# Patient Record
Sex: Male | Born: 1975 | Race: White | Hispanic: No | Marital: Married | Smoking: Never smoker
Health system: Southern US, Community
[De-identification: ages and names within clinical notes are randomized; demographics above are authoritative.]

## PROBLEM LIST (undated history)

## (undated) DIAGNOSIS — E291 Testicular hypofunction: Secondary | ICD-10-CM

## (undated) DIAGNOSIS — M549 Dorsalgia, unspecified: Secondary | ICD-10-CM

## (undated) DIAGNOSIS — G4730 Sleep apnea, unspecified: Secondary | ICD-10-CM

## (undated) DIAGNOSIS — F419 Anxiety disorder, unspecified: Secondary | ICD-10-CM

## (undated) HISTORY — DX: Testicular hypofunction: E29.1

## (undated) HISTORY — DX: Anxiety disorder, unspecified: F41.9

## (undated) HISTORY — PX: MICRODISCECTOMY LUMBAR: SUR864

## (undated) HISTORY — DX: Dorsalgia, unspecified: M54.9

## (undated) HISTORY — DX: Sleep apnea, unspecified: G47.30

---

## 2008-08-23 ENCOUNTER — Ambulatory Visit: Payer: Self-pay | Admitting: Family Medicine

## 2008-08-23 DIAGNOSIS — S61409A Unspecified open wound of unspecified hand, initial encounter: Secondary | ICD-10-CM | POA: Insufficient documentation

## 2008-10-22 ENCOUNTER — Encounter: Admission: RE | Admit: 2008-10-22 | Discharge: 2008-10-22 | Payer: Self-pay | Admitting: Chiropractic Medicine

## 2014-06-26 ENCOUNTER — Encounter: Payer: Self-pay | Admitting: Family Medicine

## 2014-06-26 ENCOUNTER — Ambulatory Visit (INDEPENDENT_AMBULATORY_CARE_PROVIDER_SITE_OTHER): Payer: BLUE CROSS/BLUE SHIELD | Admitting: Family Medicine

## 2014-06-26 VITALS — BP 115/69 | HR 50 | Ht 76.0 in | Wt 278.0 lb

## 2014-06-26 DIAGNOSIS — F329 Major depressive disorder, single episode, unspecified: Secondary | ICD-10-CM

## 2014-06-26 DIAGNOSIS — G4733 Obstructive sleep apnea (adult) (pediatric): Secondary | ICD-10-CM

## 2014-06-26 DIAGNOSIS — F32A Depression, unspecified: Secondary | ICD-10-CM

## 2014-06-26 DIAGNOSIS — E291 Testicular hypofunction: Secondary | ICD-10-CM

## 2014-06-26 DIAGNOSIS — F321 Major depressive disorder, single episode, moderate: Secondary | ICD-10-CM | POA: Insufficient documentation

## 2014-06-26 DIAGNOSIS — Z9989 Dependence on other enabling machines and devices: Secondary | ICD-10-CM

## 2014-06-26 DIAGNOSIS — F325 Major depressive disorder, single episode, in full remission: Secondary | ICD-10-CM | POA: Insufficient documentation

## 2014-06-26 HISTORY — DX: Testicular hypofunction: E29.1

## 2014-06-26 MED ORDER — SERTRALINE HCL 50 MG PO TABS: ORAL_TABLET | ORAL | Status: DC

## 2014-06-26 NOTE — Progress Notes (Signed)
CC: Russell Ortega is a 39 y.o. male is here for Establish Care   Subjective: HPI:  Very pleasant 39 year old here to establish care  Reports a history of depression that began sometime around 2009. It was heavily influenced by dissatisfaction at his work. He describes subjective depression, decreased interest in hobbies and irritability towards others. Since starting Zoloft this is greatly improved along with changing to a new position that he is more satisfied with. He's been taking this medication daily, 100 mg, and would like to know if he can taper off of it given that  He doesn't identify anything that would be causing stress or worry in his life right now. Denies known side effects.  Report a history of hypogonadism that was found in 2009 during a fatigue workup. He's been taking AndroGel for at least a year now currently up to 4 pumps daily. Testosterone was checked in January found to be 273. He reports he still has some fatigue a few days of the week but overall it's improved mildly. Denies any known side effects to the medication.  History of OSA currently using CPAP nightly. Provided he uses this he denies any issues with snoring or nonrestorative sleep.  Review of Systems - General ROS: negative for - chills, fever, night sweats, weight gain or weight loss Ophthalmic ROS: negative for - decreased vision Psychological ROS: negative for - anxiety or depression ENT ROS: negative for - hearing change, nasal congestion, tinnitus or allergies Hematological and Lymphatic ROS: negative for - bleeding problems, bruising or swollen lymph nodes Breast ROS: negative Respiratory ROS: no cough, shortness of breath, or wheezing Cardiovascular ROS: no chest pain or dyspnea on exertion Gastrointestinal ROS: no abdominal pain, change in bowel habits, or black or bloody stools Genito-Urinary ROS: negative for - genital discharge, genital ulcers, incontinence or abnormal bleeding from  genitals Musculoskeletal ROS: negative for - joint pain or muscle pain Neurological ROS: negative for - headaches or memory loss Dermatological ROS: negative for lumps, mole changes, rash and skin lesion changes  History reviewed. No pertinent past medical history.  History reviewed. No pertinent past surgical history. Family History  Problem Relation Age of Onset  . Breast cancer Mother   . Hyperlipidemia Father   . Hypertension Father     History   Social History  . Marital Status: Married    Spouse Name: N/A  . Number of Children: N/A  . Years of Education: N/A   Occupational History  . Not on file.   Social History Main Topics  . Smoking status: Never Smoker   . Smokeless tobacco: Not on file  . Alcohol Use: 0.0 oz/week    0 Standard drinks or equivalent per week  . Drug Use: No  . Sexual Activity:    Partners: Female   Other Topics Concern  . Not on file   Social History Narrative     Objective: BP 115/69 mmHg  Pulse 50  Ht 6\' 4"  (1.93 m)  Wt 278 lb (126.1 kg)  BMI 33.85 kg/m2  Vital signs reviewed. General: Alert and Oriented, No Acute Distress HEENT: Pupils equal, round, reactive to light. Conjunctivae clear.  External ears unremarkable.  Moist mucous membranes. Lungs: Clear and comfortable work of breathing, speaking in full sentences without accessory muscle use. Cardiac: Regular rate and rhythm.  Neuro: CN II-XII grossly intact, gait normal. Extremities: No peripheral edema.  Strong peripheral pulses.  Mental Status: No depression, anxiety, nor agitation. Logical though process. Skin: Warm and  dry.  Assessment & Plan: Russell Ortega was seen today for establish care.  Diagnoses and all orders for this visit:  Depression Orders: -     sertraline (ZOLOFT) 50 MG tablet; Take one by mouth daily for two weeks then half tab daily for two weeks.  Hypogonadism in male Orders: -     Testosterone  OSA on CPAP   Depression: Controlled he would like to  taper off of Zoloft therefore start 50 mg daily for 2 weeks and then half a tab daily for the 2 weeks thereafter. Call if any symptoms return during this taper. Hypergonadism: Due for repeat testosterone level, he would like to look into either patches or injections if his level is still deficient.  Return in about 3 months (around 09/26/2014) for Testosterone follow up. .Marland Kitchen

## 2014-06-27 LAB — TESTOSTERONE: Testosterone: 591 ng/dL (ref 300–890)

## 2014-06-29 ENCOUNTER — Telehealth: Payer: Self-pay | Admitting: Family Medicine

## 2014-06-29 MED ORDER — ANDROGEL PUMP 20.25 MG/ACT (1.62%) TD GEL: TRANSDERMAL | Status: DC

## 2014-06-29 NOTE — Telephone Encounter (Signed)
Sue Lushndrea, Will you please let patient know that his testosterone level was in the normal range, refill Rx in your inbox.

## 2014-06-29 NOTE — Telephone Encounter (Signed)
Left message on vm with results  

## 2014-07-06 LAB — HEPATIC FUNCTION PANEL
ALT: 35 U/L (ref 10–40)
AST: 24 U/L (ref 14–40)
Alkaline Phosphatase: 70 U/L (ref 25–125)
Bilirubin, Total: 0.4 mg/dL

## 2014-07-06 LAB — CBC AND DIFFERENTIAL
Platelets: 221 10*3/uL (ref 150–399)
WBC: 7.4 10*3/mL

## 2014-07-06 LAB — HEMOGLOBIN A1C: Hgb A1c MFr Bld: 14 % — AB (ref 4.0–6.0)

## 2014-07-06 LAB — BASIC METABOLIC PANEL
CREATININE: 0.8 mg/dL (ref 0.6–1.3)
GLUCOSE: 95 mg/dL
POTASSIUM: 4.9 mmol/L (ref 3.4–5.3)

## 2014-07-08 ENCOUNTER — Encounter: Payer: Self-pay | Admitting: Family Medicine

## 2014-07-08 DIAGNOSIS — M5416 Radiculopathy, lumbar region: Secondary | ICD-10-CM | POA: Insufficient documentation

## 2014-07-10 ENCOUNTER — Encounter: Payer: Self-pay | Admitting: Family Medicine

## 2014-07-10 ENCOUNTER — Ambulatory Visit (INDEPENDENT_AMBULATORY_CARE_PROVIDER_SITE_OTHER): Payer: BLUE CROSS/BLUE SHIELD | Admitting: Family Medicine

## 2014-07-10 VITALS — BP 112/74 | HR 52 | Wt 270.0 lb

## 2014-07-10 DIAGNOSIS — R112 Nausea with vomiting, unspecified: Secondary | ICD-10-CM | POA: Diagnosis not present

## 2014-07-10 DIAGNOSIS — R12 Heartburn: Secondary | ICD-10-CM | POA: Diagnosis not present

## 2014-07-10 DIAGNOSIS — R1013 Epigastric pain: Secondary | ICD-10-CM | POA: Diagnosis not present

## 2014-07-10 MED ORDER — PANTOPRAZOLE SODIUM 40 MG PO TBEC: 40.0000 mg | DELAYED_RELEASE_TABLET | Freq: Every day | ORAL | Status: DC

## 2014-07-10 NOTE — Patient Instructions (Signed)
Food Choices for Gastroesophageal Reflux Disease  When you have gastroesophageal reflux disease (GERD), the foods you eat and your eating habits are very important. Choosing the right foods can help ease your discomfort.   WHAT GUIDELINES DO I NEED TO FOLLOW?   · Choose fruits, vegetables, whole grains, and low-fat dairy products.    · Choose low-fat meat, fish, and poultry.  · Limit fats such as oils, salad dressings, butter, nuts, and avocado.    · Keep a food diary. This helps you identify foods that cause symptoms.    · Avoid foods that cause symptoms. These may be different for everyone.    · Eat small meals often instead of 3 large meals a day.    · Eat your meals slowly, in a place where you are relaxed.    · Limit fried foods.    · Cook foods using methods other than frying.    · Avoid drinking alcohol.    · Avoid drinking large amounts of liquids with your meals.    · Avoid bending over or lying down until 2-3 hours after eating.    WHAT FOODS ARE NOT RECOMMENDED?   These are some foods and drinks that may make your symptoms worse:  Vegetables  Tomatoes. Tomato juice. Tomato and spaghetti sauce. Chili peppers. Onion and garlic. Horseradish.  Fruits  Oranges, grapefruit, and lemon (fruit and juice).  Meats  High-fat meats, fish, and poultry. This includes hot dogs, ribs, ham, sausage, salami, and bacon.  Dairy  Whole milk and chocolate milk. Sour cream. Cream. Butter. Ice cream. Cream cheese.   Drinks  Coffee and tea. Bubbly (carbonated) drinks or energy drinks.  Condiments  Hot sauce. Barbecue sauce.   Sweets/Desserts  Chocolate and cocoa. Donuts. Peppermint and spearmint.  Fats and Oils  High-fat foods. This includes French fries and potato chips.  Other  Vinegar. Strong spices. This includes black pepper, white pepper, red pepper, cayenne, curry powder, cloves, ginger, and chili powder.  The items listed above may not be a complete list of foods and drinks to avoid. Contact your dietitian for more  information.  Document Released: 08/08/2011 Document Revised: 02/11/2013 Document Reviewed: 12/11/2012  Exit[employer]® Patient Information ©2015 [employer]. This information is not intended to replace advice given to you by your health care provider. Make sure you discuss any questions you have with your health care provider.

## 2014-07-10 NOTE — Progress Notes (Signed)
   Subjective:    Patient ID: Russell Ortega, male    DOB: 08/27/1975, 39 y.o.   MRN: [id number]  HPI 3 days ago says had some severe heartburn.  No vomited and then had diarrhea for a couple of days.  Had crackers and gingerale.  Then late Wed ( 2 days ago) pain became more severe and no relief with eating or not eating.  Had episode about 2 years ago.  No fever, chills, sweat. No blood in the stool  Still having some loose stools. Has been taking lactobacillus for about 4 months.  Has been using Aleve at work for back pain.   Review of Systems     Objective:   Physical Exam  Constitutional: He is oriented to person, place, and time. He appears well-developed and well-nourished.  HENT:  Head: Normocephalic and atraumatic.  Right Ear: External ear normal.  Left Ear: External ear normal.  Nose: Nose normal.  Mouth/Throat: Oropharynx is clear and moist.  TMs and canals are clear.   Eyes: Conjunctivae and EOM are normal. Pupils are equal, round, and reactive to light.  Neck: Neck supple. No thyromegaly present.  Cardiovascular: Normal rate and normal heart sounds.   Pulmonary/Chest: Effort normal and breath sounds normal.  Abdominal: Soft. Bowel sounds are normal. He exhibits no distension and no mass. There is tenderness. There is no rebound and no guarding.  Tenderness over the epigastrum.    Lymphadenopathy:    He has no cervical adenopathy.  Neurological: He is alert and oriented to person, place, and time.  Skin: Skin is warm and dry.  Psychiatric: He has a normal mood and affect.          Assessment & Plan:  Epigastric pain, nausea - likely gastritis vs GERD. Will start a PPI.  Can use TUMs in the interim.  Please call Monday if not feeling better or if pain gets worse, or if notices blood in the stool, or if develops a fever. Encouraged him to stop all NSAIDs. Can use Tylenol for pain relief for now. Reviewed dietary measures for reflux and handout provided.  Back pain -  Switch to Tylenol for now and consider discussing with Dr. Ivan AnchorsHommel other pain reliever like Vimovo.

## 2014-07-14 ENCOUNTER — Encounter: Payer: Self-pay | Admitting: Family Medicine

## 2014-07-14 LAB — CALCIUM: CALCIUM: 9.4 mg/dL

## 2014-07-14 LAB — TESTOSTERONE: TESTOSTERONE: 315

## 2014-08-18 ENCOUNTER — Other Ambulatory Visit: Payer: Self-pay | Admitting: Family Medicine

## 2014-08-29 ENCOUNTER — Other Ambulatory Visit: Payer: Self-pay | Admitting: Family Medicine

## 2014-10-28 ENCOUNTER — Ambulatory Visit (INDEPENDENT_AMBULATORY_CARE_PROVIDER_SITE_OTHER): Payer: BLUE CROSS/BLUE SHIELD | Admitting: Family Medicine

## 2014-10-28 ENCOUNTER — Encounter: Payer: Self-pay | Admitting: Family Medicine

## 2014-10-28 VITALS — BP 111/63 | HR 52 | Wt 273.0 lb

## 2014-10-28 DIAGNOSIS — E291 Testicular hypofunction: Secondary | ICD-10-CM

## 2014-10-28 DIAGNOSIS — F32A Depression, unspecified: Secondary | ICD-10-CM

## 2014-10-28 DIAGNOSIS — F329 Major depressive disorder, single episode, unspecified: Secondary | ICD-10-CM

## 2014-10-28 LAB — HEMOGLOBIN: HEMOGLOBIN: 15.1 g/dL (ref 13.0–17.0)

## 2014-10-28 MED ORDER — SERTRALINE HCL 25 MG PO TABS: ORAL_TABLET | ORAL | Status: DC

## 2014-10-28 NOTE — Progress Notes (Signed)
CC: Russell Ortega is a 39 y.o. male is here for f/u depression   Subjective: HPI:  Follow-up hypogonadism: He ran out of testosterone topical gel a few months ago and try to CT still actually need to be on the medication. Within 3 days he experienced extreme fatigue which was present all hours of the day and did not resolve until 1 or 2 days after restarting the gel. He tells me he feels like he has plenty of energy and denies any known side effects. He would like refills possible.  Follow-up depression: He was unable to come off of Zoloft since I saw him last. The days following each step down and the taper caused him to be extremely irritable towards others and he didn't want to engage in any social activity. He denies any thoughts of harm self or others or subjective depression. He really would not like to take this medication if he can get by without it. He wants another way to stop this. He denies any known side effects from it but just doesn't like the idea of taking medication.   Review Of Systems Outlined In HPI  No past medical history on file.  No past surgical history on file. Family History  Problem Relation Age of Onset  . Breast cancer Mother   . Hyperlipidemia Father   . Hypertension Father     Social History   Social History  . Marital Status: Married    Spouse Name: N/A  . Number of Children: N/A  . Years of Education: N/A   Occupational History  . Not on file.   Social History Main Topics  . Smoking status: Never Smoker   . Smokeless tobacco: Not on file  . Alcohol Use: 0.0 oz/week    0 Standard drinks or equivalent per week  . Drug Use: No  . Sexual Activity:    Partners: Female   Other Topics Concern  . Not on file   Social History Narrative     Objective: BP 111/63 mmHg  Pulse 52  Wt 273 lb (123.832 kg)  Vital signs reviewed. General: Alert and Oriented, No Acute Distress HEENT: Pupils equal, round, reactive to light. Conjunctivae clear.   External ears unremarkable.  Moist mucous membranes. Lungs: Clear and comfortable work of breathing, speaking in full sentences without accessory muscle use. Cardiac: Regular rate and rhythm.  Neuro: CN II-XII grossly intact, gait normal. Extremities: No peripheral edema.  Strong peripheral pulses.  Mental Status: No depression, anxiety, nor agitation. Logical though process. Skin: Warm and dry.  Assessment & Plan: Russell Ortega was seen today for f/u depression.  Diagnoses and all orders for this visit:  Hypogonadism in male -     Hemoglobin -     PSA -     Testosterone  Depression  Other orders -     sertraline (ZOLOFT) 25 MG tablet; One by mouth daily for two weeks, then begin half tablet daily thereafter.  Consider stopping after four total weeks.   Hypogonadism: Clinically controlled to for repeat testosterone level to ensure he remains in a physiologic range and to ensure hemoglobin and PSA are not elevated. Time was taken to answer all his questions regarding the physiology of testosterone and why we checked the above tests. Depression: Controlled, we discussed a longer taper in hopes of minimizing any side effects while coming off of this medication. I also discussed that there is a liquid formulation that we could use if any abrupt decrease in the  dose causes intolerable side effects.  25 minutes spent face-to-face during visit today of which at least 50% was counseling or coordinating care regarding: 1. Hypogonadism in male   2. Depression      Return in about 6 months (around 04/27/2015) for Testosterone Follow Up.

## 2014-10-29 ENCOUNTER — Telehealth: Payer: Self-pay | Admitting: Family Medicine

## 2014-10-29 LAB — PSA: PSA: 0.72 ng/mL (ref <=4.00)

## 2014-10-29 LAB — TESTOSTERONE: TESTOSTERONE: 399 ng/dL (ref 300–890)

## 2014-10-29 MED ORDER — TESTOSTERONE 20.25 MG/ACT (1.62%) TD GEL: TRANSDERMAL | Status: DC

## 2014-10-29 NOTE — Telephone Encounter (Signed)
Pt.notified

## 2014-10-29 NOTE — Telephone Encounter (Signed)
Sue Lush, Will you please let patient know that his labs were normal, a Rx for testosterone is in your in box.  I'd recommend f/u in 6 months.

## 2014-12-30 ENCOUNTER — Emergency Department
Admission: EM | Admit: 2014-12-30 | Discharge: 2014-12-30 | Disposition: A | Discharge status: Home / Self Care | Payer: BLUE CROSS/BLUE SHIELD | Source: Home / Self Care | Attending: Family Medicine | Admitting: Family Medicine

## 2014-12-30 ENCOUNTER — Encounter: Payer: Self-pay | Admitting: *Deleted

## 2014-12-30 DIAGNOSIS — R509 Fever, unspecified: Secondary | ICD-10-CM | POA: Diagnosis not present

## 2014-12-30 LAB — POCT INFLUENZA A/B
Influenza A, POC: NEGATIVE
Influenza B, POC: NEGATIVE

## 2014-12-30 MED ORDER — LOPERAMIDE HCL 2 MG PO CAPS: 2.0000 mg | ORAL_CAPSULE | Freq: Four times a day (QID) | ORAL | Status: DC | PRN

## 2014-12-30 MED ORDER — ACETAMINOPHEN 325 MG PO TABS
650.0000 mg | ORAL_TABLET | Freq: Once | ORAL | Status: AC
  Administered 2014-12-30: 650 mg via ORAL

## 2014-12-30 NOTE — ED Provider Notes (Signed)
CSN: [id number]     Arrival date & time 12/30/14  1702 History   First MD Initiated Contact with Patient 12/30/14 1713     Chief Complaint  Patient presents with  . Diarrhea  . Generalized Body Aches   (Consider location/radiation/quality/duration/timing/severity/associated sxs/prior Treatment) HPI Pt is a 39yo male presenting to [facility] with c/o sudden onset body aches, mild generalized abdominal cramping and diarrhea that started this morning. Body aches are 6/10 in severity.  Pt also reports fever up to 100.4.  He has not tried any medications PTA. Pt reports about 3 episodes of watery diarrhea then 1-2 other episodes of loose stool. No blood or mucous in stool. No hx of abdominal surgeries. No sick contacts or recent travel.  He did not receive flu vaccine this year.   History reviewed. No pertinent past medical history. History reviewed. No pertinent past surgical history. Family History  Problem Relation Age of Onset  . Breast cancer Mother   . Hyperlipidemia Father   . Hypertension Father    Social History  Substance Use Topics  . Smoking status: Never Smoker   . Smokeless tobacco: None  . Alcohol Use: 0.0 oz/week    0 Standard drinks or equivalent per week    Review of Systems  Constitutional: Positive for fever, chills and fatigue.  HENT: Negative for congestion.   Respiratory: Negative for cough and shortness of breath.   Gastrointestinal: Positive for abdominal pain (cramping) and diarrhea. Negative for nausea, vomiting and constipation.  Genitourinary: Negative for dysuria, frequency and hematuria.  Musculoskeletal: Positive for myalgias and arthralgias.       Body aches    Allergies  Lidocaine  Home Medications   Prior to Admission medications   Medication Sig Start Date End Date Taking? Authorizing Provider  loperamide (IMODIUM) 2 MG capsule Take 1 capsule (2 mg total) by mouth 4 (four) times daily as needed for diarrhea or loose stools. 12/30/14   Junius Finner,  PA-C  pantoprazole (PROTONIX) 40 MG tablet Take 1 tablet (40 mg total) by mouth daily. 07/10/14   Agapito Games, MD  sertraline (ZOLOFT) 25 MG tablet One by mouth daily for two weeks, then begin half tablet daily thereafter.  Consider stopping after four total weeks. 10/28/14   Laren Boom, DO  Testosterone (ANDROGEL PUMP) 20.25 MG/ACT (1.62%) GEL APPLY 4 PUMPS TO THE SHOULDERS DAILY AS DIRECTED 10/29/14   Laren Boom, DO   Meds Ordered and Administered this Visit   Medications  acetaminophen (TYLENOL) tablet 650 mg (650 mg Oral Given 12/30/14 1731)    BP 105/71 mmHg  Pulse 107  Temp(Src) 100.4 F (38 C) (Oral)  Resp 18  Wt 273 lb (123.832 kg)  SpO2 99% No data found.   Physical Exam  Constitutional: He appears well-developed and well-nourished.  Pt lying on exam bed, appears mildly fatigued and uncomfortable   HENT:  Head: Normocephalic and atraumatic.  Eyes: Conjunctivae are normal. No scleral icterus.  Neck: Normal range of motion. Neck supple.  Cardiovascular: Regular rhythm and normal heart sounds.  Tachycardia present.   Mild tachycardia  Pulmonary/Chest: Effort normal and breath sounds normal. No respiratory distress. He has no wheezes. He has no rales. He exhibits no tenderness.  Abdominal: Soft. He exhibits no distension and no mass. Bowel sounds are increased. There is tenderness. There is no rebound and no guarding.  Soft, non-distended. Mild diffuse tenderness.   Musculoskeletal: Normal range of motion.  Neurological: He is alert.  Skin: Skin is warm  and dry.  Nursing note and vitals reviewed.   ED Course  Procedures (including critical care time)  Labs Review Labs Reviewed  POCT INFLUENZA A/B    Imaging Review No results found.    MDM   1. Fever, unspecified     Pt c/o body aches, generalized abdominal cramping and diarrhea. Pt appears well hydrated with moist mucous membranes but does have mild tachycardia with HR at 107.  Likely due to pt  having a temp of 100.4 and body aches. Rapid Flu: negative Pt appears well hydrated. Abdominal exam not concerning for acute abdomen including but not limited to diverticulitis, SBO, cholecystitis or appendicitis. Will hold off on labs at this time.   Rx: imodium.  Encouraged to only take if he is having more than 5 episodes of watery diarrhea. Encouraged to rest, stay well hydrated, and alternate acetaminophen and ibuprofen for fever and pain. F/u with PCP in 3-4 days if not improving, sooner if worsening.  Discussed symptoms that warrant emergent care in the ED. Pt initially declined a work note but then accepted one. Note written for pt to be off 1 day.  Patient verbalized understanding and agreement with treatment plan.    Junius Finnerrin O'Malley, PA-C 12/30/14 1742

## 2014-12-30 NOTE — ED Notes (Signed)
Pt c/o body aches, diarrhea since this AM. Temp of 100.4. No otc meds used.

## 2014-12-30 NOTE — Discharge Instructions (Signed)
You may take 400-600mg  Ibuprofen (Motrin) every 6-8 hours for fever and pain  Alternate with Tylenol  You may take 500mg  Tylenol every 4-6 hours as needed for fever and pain  Follow-up with your primary care provider next week for recheck of symptoms if not improving.  Be sure to drink plenty of fluids and rest, at least 8hrs of sleep a night, preferably more while you are sick. Return urgent care or go to closest ER if you cannot keep down fluids/signs of dehydration, fever not reducing with Tylenol, difficulty breathing/wheezing, stiff neck, worsening condition, or other concerns (see below)  Call 911 or go to closest ER if symptoms worsen including severe abdominal pain, unable to keep down fluids, blood in stool, or other new concerning symptoms develop

## 2015-01-25 ENCOUNTER — Telehealth: Payer: Self-pay

## 2015-01-25 NOTE — Telephone Encounter (Signed)
PA for Androgel sent through covermymeds. PA approved.

## 2015-02-01 ENCOUNTER — Other Ambulatory Visit: Payer: Self-pay | Admitting: Family Medicine

## 2015-02-02 NOTE — Telephone Encounter (Signed)
I'm not sure if this refill is appropriate looking at your previous insrtuctions for pt.

## 2015-02-02 NOTE — Telephone Encounter (Signed)
I've sent a Rx to his [facility] and would recommend he f/u with me within the next 30 days to discuss a long term plan for this medication.

## 2015-03-01 ENCOUNTER — Other Ambulatory Visit: Payer: Self-pay | Admitting: Family Medicine

## 2015-03-20 ENCOUNTER — Other Ambulatory Visit: Payer: Self-pay | Admitting: Family Medicine

## 2015-03-30 ENCOUNTER — Encounter: Payer: Self-pay | Admitting: Emergency Medicine

## 2015-03-30 ENCOUNTER — Emergency Department
Admission: EM | Admit: 2015-03-30 | Discharge: 2015-03-30 | Disposition: A | Discharge status: Home / Self Care | Payer: BLUE CROSS/BLUE SHIELD | Source: Home / Self Care | Attending: Family Medicine | Admitting: Family Medicine

## 2015-03-30 DIAGNOSIS — J019 Acute sinusitis, unspecified: Secondary | ICD-10-CM | POA: Diagnosis not present

## 2015-03-30 MED ORDER — AMOXICILLIN-POT CLAVULANATE 875-125 MG PO TABS: 1.0000 | ORAL_TABLET | Freq: Two times a day (BID) | ORAL | Status: DC

## 2015-03-30 MED ORDER — SALINE SPRAY 0.65 % NA SOLN: 1.0000 | NASAL | Status: DC | PRN

## 2015-03-30 NOTE — ED Notes (Signed)
Sinus pain, pressure, congestion, cough x 3 days

## 2015-03-30 NOTE — Discharge Instructions (Signed)
You may take 400-600mg Ibuprofen (Motrin) every 6-8 hours for fever and pain  °Alternate with Tylenol  °You may take 500mg Tylenol every 4-6 hours as needed for fever and pain  °Follow-up with your primary care provider next week for recheck of symptoms if not improving.  °Be sure to drink plenty of fluids and rest, at least 8hrs of sleep a night, preferably more while you are sick. °Return urgent care or go to closest ER if you cannot keep down fluids/signs of dehydration, fever not reducing with Tylenol, difficulty breathing/wheezing, stiff neck, worsening condition, or other concerns (see below)  °Please take antibiotics as prescribed and be sure to complete entire course even if you start to feel better to ensure infection does not come back. ° ° °Sinus Rinse °WHAT IS A SINUS RINSE? °A sinus rinse is a simple home treatment that is used to rinse your sinuses with a sterile mixture of salt and water (saline solution). Sinuses are air-filled spaces in your skull behind the bones of your face and forehead that open into your nasal cavity. °You will use the following: °· Saline solution. °· Neti pot or spray bottle. This releases the saline solution into your nose and through your sinuses. Neti pots and spray bottles can be purchased at your local pharmacy, a health food store, or online. °WHEN WOULD I DO A SINUS RINSE? °A sinus rinse can help to clear mucus, dirt, dust, or pollen from the nasal cavity. You may do a sinus rinse when you have a cold, a virus, nasal allergy symptoms, a sinus infection, or stuffiness in the nose or sinuses. °If you are considering a sinus rinse: °· Ask your child's health care provider before performing a sinus rinse on your child. °· Do not do a sinus rinse if you have had ear or nasal surgery, ear infection, or blocked ears. °HOW DO I DO A SINUS RINSE? °· Wash your hands. °· Disinfect your device according to the directions provided and then dry it. °· Use the solution that comes  with your device or one that is sold separately in stores. Follow the mixing directions on the package. °· Fill your device with the amount of saline solution as directed by the device instructions. °· Stand over a sink and tilt your head sideways over the sink. °· Place the spout of the device in your upper nostril (the one closer to the ceiling). °· Gently pour or squeeze the saline solution into the nasal cavity. The liquid should drain to the lower nostril if you are not overly congested. °· Gently blow your nose. Blowing too hard may cause ear pain. °· Repeat in the other nostril. °· Clean and rinse your device with clean water and then air-dry it. °ARE THERE RISKS OF A SINUS RINSE?  °Sinus rinse is generally very safe and effective. However, there are a few risks, which include:  °· A burning sensation in the sinuses. This may happen if you do not make the saline solution as directed. Make sure to follow all directions when making the saline solution. °· Infection from contaminated water. This is rare, but possible. °· Nasal irritation. °  °This information is not intended to replace advice given to you by your health care provider. Make sure you discuss any questions you have with your health care provider. °  °Document Released: 09/03/2013 Document Reviewed: 09/03/2013 °Elsevier Interactive Patient Education ©2016 [employer] ° °Sinusitis, Adult °Sinusitis is redness, soreness, and inflammation of the paranasal sinuses.   Paranasal sinuses are air pockets within the bones of your face. They are located beneath your eyes, in the middle of your forehead, and above your eyes. In healthy paranasal sinuses, mucus is able to drain out, and air is able to circulate through them by way of your nose. However, when your paranasal sinuses are inflamed, mucus and air can become trapped. This can allow bacteria and other germs to grow and cause infection. °Sinusitis can develop quickly and last only a short time (acute)  or continue over a long period (chronic). Sinusitis that lasts for more than 12 weeks is considered chronic. °CAUSES °Causes of sinusitis include: °· Allergies. °· Structural abnormalities, such as displacement of the cartilage that separates your nostrils (deviated septum), which can decrease the air flow through your nose and sinuses and affect sinus drainage. °· Functional abnormalities, such as when the small hairs (cilia) that line your sinuses and help remove mucus do not work properly or are not present. °SIGNS AND SYMPTOMS °Symptoms of acute and chronic sinusitis are the same. The primary symptoms are pain and pressure around the affected sinuses. Other symptoms include: °· Upper toothache. °· Earache. °· Headache. °· Bad breath. °· Decreased sense of smell and taste. °· A cough, which worsens when you are lying flat. °· Fatigue. °· Fever. °· Thick drainage from your nose, which often is green and may contain pus (purulent). °· Swelling and warmth over the affected sinuses. °DIAGNOSIS °Your health care provider will perform a physical exam. During your exam, your health care provider may perform any of the following to help determine if you have acute sinusitis or chronic sinusitis: °· Look in your nose for signs of abnormal growths in your nostrils (nasal polyps). °· Tap over the affected sinus to check for signs of infection. °· View the inside of your sinuses using an imaging device that has a light attached (endoscope). °If your health care provider suspects that you have chronic sinusitis, one or more of the following tests may be recommended: °· Allergy tests. °· Nasal culture. A sample of mucus is taken from your nose, sent to a lab, and screened for bacteria. °· Nasal cytology. A sample of mucus is taken from your nose and examined by your health care provider to determine if your sinusitis is related to an allergy. °TREATMENT °Most cases of acute sinusitis are related to a viral infection and will  resolve on their own within 10 days. Sometimes, medicines are prescribed to help relieve symptoms of both acute and chronic sinusitis. These may include pain medicines, decongestants, nasal steroid sprays, or saline sprays. °However, for sinusitis related to a bacterial infection, your health care provider will prescribe antibiotic medicines. These are medicines that will help kill the bacteria causing the infection. °Rarely, sinusitis is caused by a fungal infection. In these cases, your health care provider will prescribe antifungal medicine. °For some cases of chronic sinusitis, surgery is needed. Generally, these are cases in which sinusitis recurs more than 3 times per year, despite other treatments. °HOME CARE INSTRUCTIONS °· Drink plenty of water. Water helps thin the mucus so your sinuses can drain more easily. °· Use a humidifier. °· Inhale steam 3-4 times a day (for example, sit in the bathroom with the shower running). °· Apply a warm, moist washcloth to your face 3-4 times a day, or as directed by your health care provider. °· Use saline nasal sprays to help moisten and clean your sinuses. °· Take medicines only as directed   by your health care provider. °· If you were prescribed either an antibiotic or antifungal medicine, finish it all even if you start to feel better. °SEEK IMMEDIATE MEDICAL CARE IF: °· You have increasing pain or severe headaches. °· You have nausea, vomiting, or drowsiness. °· You have swelling around your face. °· You have vision problems. °· You have a stiff neck. °· You have difficulty breathing. °  °This information is not intended to replace advice given to you by your health care provider. Make sure you discuss any questions you have with your health care provider. °  °Document Released: 02/06/2005 Document Revised: 02/27/2014 Document Reviewed: 02/21/2011 °Elsevier Interactive Patient Education ©2016 [employer] Inc. ° ° °

## 2015-03-30 NOTE — ED Provider Notes (Signed)
CSN: [id number]     Arrival date & time 03/30/15  1335 History   First MD Initiated Contact with Patient 03/30/15 1341     Chief Complaint  Patient presents with  . Sinus Problem   (Consider location/radiation/quality/duration/timing/severity/associated sxs/prior Treatment) HPI Pt is a 40yo male presenting to [facility] with c/o nasal congestion with worsening facial pain.  He also reports mild intermittent productive cough.  He has been taking OTC cold/flu medication with mild temporary relief but facial pain keeps worsening.  Pain is moderate and aching.  He also reports mild dizziness with the pressure, worse when leaning his head forward. Denies fever, chills, n/v/d.   History reviewed. No pertinent past medical history. History reviewed. No pertinent past surgical history. Family History  Problem Relation Age of Onset  . Breast cancer Mother   . Hyperlipidemia Father   . Hypertension Father    Social History  Substance Use Topics  . Smoking status: Never Smoker   . Smokeless tobacco: None  . Alcohol Use: 0.0 oz/week    0 Standard drinks or equivalent per week    Review of Systems  Constitutional: Negative for fever and chills.  HENT: Positive for congestion, postnasal drip, rhinorrhea, sinus pressure and sore throat. Negative for ear pain, trouble swallowing and voice change.   Respiratory: Positive for cough. Negative for shortness of breath.   Cardiovascular: Negative for chest pain and palpitations.  Gastrointestinal: Negative for nausea, vomiting, abdominal pain and diarrhea.  Musculoskeletal: Negative for myalgias, back pain and arthralgias.  Skin: Negative for rash.  Neurological: Positive for dizziness and headaches. Negative for syncope and light-headedness.    Allergies  Lidocaine  Home Medications   Prior to Admission medications   Medication Sig Start Date End Date Taking? Authorizing Provider  amoxicillin-clavulanate (AUGMENTIN) 875-125 MG tablet Take 1 tablet by  mouth 2 (two) times daily. One po bid x 7 days 03/30/15   Junius Finner, PA-C  ANDROGEL PUMP 20.25 MG/ACT (1.62%) GEL APPLY 4 PUMPS TOPICALLY TO THE SHOULDERS EVERY DAY AS DIRECTED 03/22/15   Laren Boom, DO  loperamide (IMODIUM) 2 MG capsule Take 1 capsule (2 mg total) by mouth 4 (four) times daily as needed for diarrhea or loose stools. 12/30/14   Junius Finner, PA-C  pantoprazole (PROTONIX) 40 MG tablet Take 1 tablet (40 mg total) by mouth daily. 07/10/14   Agapito Games, MD  sertraline (ZOLOFT) 25 MG tablet TAKE 1 TABLET BY MOUTH DAILY. DUE FOR A FOLLOW UP APPOINTMENT 03/22/15   Laren Boom, DO  sodium chloride (OCEAN) 0.65 % SOLN nasal spray Place 1 spray into both nostrils as needed. 03/30/15   Junius Finner, PA-C   Meds Ordered and Administered this Visit  Medications - No data to display  BP 118/73 mmHg  Pulse 57  Temp(Src) 97.7 F (36.5 C) (Oral)  Ht  (1.93 m)  Wt 275 lb (124.739 kg)  BMI 33.49 kg/m2  SpO2 96% No data found.   Physical Exam  Constitutional: He appears well-developed and well-nourished.  HENT:  Head: Normocephalic and atraumatic.  Right Ear: Hearing, tympanic membrane, external ear and ear canal normal.  Left Ear: Hearing, tympanic membrane, external ear and ear canal normal.  Nose: Mucosal edema and rhinorrhea present. Right sinus exhibits maxillary sinus tenderness and frontal sinus tenderness. Left sinus exhibits maxillary sinus tenderness and frontal sinus tenderness.  Mouth/Throat: Uvula is midline, oropharynx is clear and moist and mucous membranes are normal.  Eyes: Conjunctivae are normal. No scleral icterus.  Neck: Normal range of motion. Neck supple.  Cardiovascular: Normal rate, regular rhythm and normal heart sounds.   Pulmonary/Chest: Effort normal and breath sounds normal. No stridor. No respiratory distress. He has no wheezes. He has no rales. He exhibits no tenderness.  Abdominal: Soft. He exhibits no distension. There is no tenderness.   Musculoskeletal: Normal range of motion.  Lymphadenopathy:    He has no cervical adenopathy.  Neurological: He is alert.  Skin: Skin is warm and dry.  Nursing note and vitals reviewed.   ED Course  Procedures (including critical care time)  Labs Review Labs Reviewed - No data to display  Imaging Review No results found.    MDM   1. Acute rhinosinusitis     Pt c/o worsening frontal headache with sinus pressure, nasal congestion and dizziness.  Due to severity of pain, will tx for bacterial cause of sinusitis. Rx: augmentin and nasal saline  Advised pt to use acetaminophen and ibuprofen as needed for fever and pain. Encouraged rest and fluids. F/u with PCP in 7-10 days if not improving, sooner if worsening. Pt verbalized understanding and agreement with tx plan.   Junius Finner, PA-C 03/30/15 [phone]

## 2015-04-07 ENCOUNTER — Other Ambulatory Visit: Payer: Self-pay | Admitting: Family Medicine

## 2015-04-22 ENCOUNTER — Other Ambulatory Visit: Payer: Self-pay | Admitting: Family Medicine

## 2015-04-22 ENCOUNTER — Telehealth: Payer: Self-pay | Admitting: Family Medicine

## 2015-04-22 MED ORDER — TESTOSTERONE 20.25 MG/ACT (1.62%) TD GEL: TRANSDERMAL | Status: DC

## 2015-04-22 NOTE — Addendum Note (Signed)
Addended by: Laren Boom on: 04/22/2015 11:58 AM   Modules accepted: Orders

## 2015-04-22 NOTE — Telephone Encounter (Signed)
I called patient and he let me know he does have a appointment scheduled with Dr. Ivan Anchors on 05/04/15 at 10:45. - CF

## 2015-04-27 ENCOUNTER — Ambulatory Visit: Payer: Self-pay | Admitting: Family Medicine

## 2015-05-04 ENCOUNTER — Ambulatory Visit: Payer: Self-pay | Admitting: Family Medicine

## 2015-05-07 ENCOUNTER — Encounter: Payer: Self-pay | Admitting: Family Medicine

## 2015-05-07 ENCOUNTER — Ambulatory Visit (INDEPENDENT_AMBULATORY_CARE_PROVIDER_SITE_OTHER): Payer: BLUE CROSS/BLUE SHIELD | Admitting: Family Medicine

## 2015-05-07 VITALS — BP 120/75 | HR 65 | Wt 280.0 lb

## 2015-05-07 DIAGNOSIS — F329 Major depressive disorder, single episode, unspecified: Secondary | ICD-10-CM

## 2015-05-07 DIAGNOSIS — E291 Testicular hypofunction: Secondary | ICD-10-CM

## 2015-05-07 DIAGNOSIS — F32A Depression, unspecified: Secondary | ICD-10-CM

## 2015-05-07 MED ORDER — SERTRALINE HCL 25 MG PO TABS: ORAL_TABLET | ORAL | Status: DC

## 2015-05-07 NOTE — Progress Notes (Signed)
CC: Russell Ortega is a 40 y.o. male is here for Depression   Subjective: HPI:  Follow-up hypogonadism: He tells me that using the topical gel daily is starting to become a hassle. He wants something else he can use for testosterone supplementation. He still feels of this has greatly improved his depression and fatigue. He denies any known side effects.  He would like to start tapering down off of Zoloft. He wants know the what's the easiest way to do this. In the past he's had difficulty with this however he was never on testosterone when that happened and he suspects that he   was actually feeling depressed due to hypogonadism and not any mental disturbance. He denies any depression or anxiety at the present time.  Review Of Systems Outlined In HPI  No past medical history on file.  No past surgical history on file. Family History  Problem Relation Age of Onset  . Breast cancer Mother   . Hyperlipidemia Father   . Hypertension Father     Social History   Social History  . Marital Status: Married    Spouse Name: N/A  . Number of Children: N/A  . Years of Education: N/A   Occupational History  . Not on file.   Social History Main Topics  . Smoking status: Never Smoker   . Smokeless tobacco: Not on file  . Alcohol Use: 0.0 oz/week    0 Standard drinks or equivalent per week  . Drug Use: No  . Sexual Activity:    Partners: Female   Other Topics Concern  . Not on file   Social History Narrative     Objective: BP 120/75 mmHg  Pulse 65  Wt 280 lb (127.007 kg)  Vital signs reviewed. General: Alert and Oriented, No Acute Distress HEENT: Pupils equal, round, reactive to light. Conjunctivae clear.  External ears unremarkable.  Moist mucous membranes. Lungs: Clear and comfortable work of breathing, speaking in full sentences without accessory muscle use. Cardiac: Regular rate and rhythm.  Neuro: CN II-XII grossly intact, gait normal. Extremities: No peripheral edema.   Strong peripheral pulses.  Mental Status: No depression, anxiety, nor agitation. Logical though process. Skin: Warm and dry. Assessment & Plan: Russell Ortega was seen today for depression.  Diagnoses and all orders for this visit:  Hypogonadism in male  Depression  Other orders -     Cancel: Testosterone (ANDROGEL PUMP) 20.25 MG/ACT (1.62%) GEL; Apply two pumps to each shoulder daily. -     sertraline (ZOLOFT) 25 MG tablet; TAKE 1/2 TABLET BY MOUTH DAILY.   Hypogonadism: Discussed the option of injectable testosterone supplementation, he likes the idea of this and will start today with a nurse administered dose followed by a teaching session and dose 2 weeks later with a nurse visit   depression: Controlled with Zoloft, starting tapering process with 12.5 mg daily  Return in about 2 weeks (around 05/21/2015) for Nurse Visit for T shot..Marland Kitchen

## 2015-05-21 ENCOUNTER — Ambulatory Visit (INDEPENDENT_AMBULATORY_CARE_PROVIDER_SITE_OTHER): Payer: BLUE CROSS/BLUE SHIELD | Admitting: Family Medicine

## 2015-05-21 VITALS — BP 119/68 | HR 49

## 2015-05-21 DIAGNOSIS — E291 Testicular hypofunction: Secondary | ICD-10-CM

## 2015-05-21 MED ORDER — TESTOSTERONE CYPIONATE 200 MG/ML IM SOLN
200.0000 mg | Freq: Once | INTRAMUSCULAR | Status: AC
  Administered 2015-05-21: 200 mg via INTRAMUSCULAR

## 2015-05-21 NOTE — Progress Notes (Signed)
Russell Ortega is here for a testosterone injection. Denies chest pain, shortness of breath, headaches or mood changes.    Patient tolerated injection well without complications. Patient advised to schedule next injection 14 days from today.

## 2015-06-03 ENCOUNTER — Ambulatory Visit (INDEPENDENT_AMBULATORY_CARE_PROVIDER_SITE_OTHER): Payer: BLUE CROSS/BLUE SHIELD | Admitting: Family Medicine

## 2015-06-03 ENCOUNTER — Encounter: Payer: Self-pay | Admitting: Family Medicine

## 2015-06-03 VITALS — BP 149/52 | HR 60 | Ht 76.0 in | Wt 285.0 lb

## 2015-06-03 DIAGNOSIS — E291 Testicular hypofunction: Secondary | ICD-10-CM

## 2015-06-03 MED ORDER — TESTOSTERONE CYPIONATE 200 MG/ML IM SOLN
200.0000 mg | Freq: Once | INTRAMUSCULAR | Status: AC
  Administered 2015-06-03: 200 mg via INTRAMUSCULAR

## 2015-06-03 NOTE — Progress Notes (Signed)
   Subjective:    Patient ID: Russell Ortega, male    DOB: 12/31/1975, 40 y.o.   MRN: [id number] Pt in for testosterone injection today.  Given LUOQ with no complications.   Donne AnonAmber Tobiah Celestine, CMA HPI    Review of Systems     Objective:   Physical Exam        Assessment & Plan:

## 2015-06-17 ENCOUNTER — Ambulatory Visit: Payer: Self-pay

## 2015-06-18 ENCOUNTER — Ambulatory Visit (INDEPENDENT_AMBULATORY_CARE_PROVIDER_SITE_OTHER): Payer: BLUE CROSS/BLUE SHIELD | Admitting: Family Medicine

## 2015-06-18 VITALS — BP 105/65 | HR 50 | Wt 287.0 lb

## 2015-06-18 DIAGNOSIS — E291 Testicular hypofunction: Secondary | ICD-10-CM | POA: Diagnosis not present

## 2015-06-18 MED ORDER — TESTOSTERONE CYPIONATE 200 MG/ML IM SOLN
200.0000 mg | Freq: Once | INTRAMUSCULAR | Status: AC
  Administered 2015-06-18: 200 mg via INTRAMUSCULAR

## 2015-06-18 NOTE — Progress Notes (Signed)
Russell Ortega is here for a testosterone injection. Denies chest pain, shortness of breath, headaches or mood changes.   Patient tolerated injection well without complications. Patient advised to schedule next injection 14 days from today.

## 2015-07-01 ENCOUNTER — Ambulatory Visit (INDEPENDENT_AMBULATORY_CARE_PROVIDER_SITE_OTHER): Payer: BLUE CROSS/BLUE SHIELD | Admitting: Family Medicine

## 2015-07-01 VITALS — BP 126/73 | HR 51 | Ht 76.0 in | Wt 286.0 lb

## 2015-07-01 DIAGNOSIS — E291 Testicular hypofunction: Secondary | ICD-10-CM

## 2015-07-01 MED ORDER — TESTOSTERONE CYPIONATE 200 MG/ML IM SOLN
200.0000 mg | INTRAMUSCULAR | Status: DC
  Administered 2015-07-01: 200 mg via INTRAMUSCULAR

## 2015-07-01 NOTE — Progress Notes (Signed)
Yes he can start giving himself injections at home as long as he can show one of the assistants here that he knows how to do it with good form.  Taking 100mg  weekly is also perfectly fine if he'd like to do that instead.

## 2015-07-01 NOTE — Progress Notes (Signed)
Patient was in office for Testosterone injection. 200 mg of Depo Testosterone was given LUOQ. Patient denied any headaches, dizziness, or shortness of Breath. Patient did want to know if he could start giving himself injections at home. He wants to know if he can do 100 mg once a week to level out his numbers. Please advise. Russell Ortega,CMA

## 2015-07-16 ENCOUNTER — Telehealth: Payer: Self-pay | Admitting: *Deleted

## 2015-07-16 ENCOUNTER — Ambulatory Visit (INDEPENDENT_AMBULATORY_CARE_PROVIDER_SITE_OTHER): Payer: BLUE CROSS/BLUE SHIELD | Admitting: Family Medicine

## 2015-07-16 VITALS — BP 124/69 | HR 44 | Wt 287.0 lb

## 2015-07-16 DIAGNOSIS — E291 Testicular hypofunction: Secondary | ICD-10-CM | POA: Diagnosis not present

## 2015-07-16 MED ORDER — "HYPODERMIC NEEDLE 22G X 1-1/2"" MISC": Status: DC

## 2015-07-16 MED ORDER — SYRINGE (DISPOSABLE) 1 ML MISC: Status: DC

## 2015-07-16 MED ORDER — "NEEDLE (DISP) 18G X 1-1/2"" MISC": Status: DC

## 2015-07-16 MED ORDER — TESTOSTERONE CYPIONATE 200 MG/ML IM SOLN: 100.0000 mg | INTRAMUSCULAR | Status: DC

## 2015-07-16 MED ORDER — TESTOSTERONE CYPIONATE 200 MG/ML IM SOLN
200.0000 mg | Freq: Once | INTRAMUSCULAR | Status: AC
  Administered 2015-07-16: 200 mg via INTRAMUSCULAR

## 2015-07-16 NOTE — Progress Notes (Signed)
Patient came into office today for testosterone injection. Denies chest pain, shortness of breath, headaches and problems associated with taking this medication. Patient states he has had no abnornal mood swings. Patient does report he feels more fatigued as he nears his next injection. He questions if doing 100mg  weekly would be an option. I recommended that if Pt wants to inject weekly to come in for education on self-administration, Pt is fine with that plan. Patient tolerated injection well in ROUQ without complications. Spoke with PCP, he is OK with Pt self administering 100mg  weekly. Patient advised to schedule his next injection for 2 weeks from today and to bring his supplies so we can educate on self-administration.

## 2015-07-20 NOTE — Telephone Encounter (Signed)
PA submitted and approved for testosterone cypionate   LVAKQW - PA Case ID: [id number]  Approvedtoday  CaseId:[id number];Product Name:Injectable testosterone products (Depo-Testosterone, Delatestryl, Testopel, testosterone cypionate, testosterone enanthate, Aveed) 75 [place] - [employer];Status:Approved;Coverage Start Date:06/20/2015;Coverage End Date:07/19/2016;  Message left with pharm and left  Discreet message on patients vm

## 2015-07-30 ENCOUNTER — Ambulatory Visit (INDEPENDENT_AMBULATORY_CARE_PROVIDER_SITE_OTHER): Payer: BLUE CROSS/BLUE SHIELD | Admitting: Sports Medicine

## 2015-07-30 VITALS — BP 121/67 | HR 65 | Wt 289.0 lb

## 2015-07-30 DIAGNOSIS — E291 Testicular hypofunction: Secondary | ICD-10-CM

## 2015-07-30 NOTE — Patient Instructions (Signed)
Pt advised to f/u prn. -EMH/RMA

## 2015-07-30 NOTE — Progress Notes (Signed)
Russell Ortega presents to the clinic to be educated on self administration. Pt had personal Rx materials (1 mL testosterone vial, needles and syringe) with him during the visit.  He was educated on the difference of the 2 needles and the importance of cleanliness of the vial and skin injection site.  Pt verbalized his understanding. I talked pt through each step as he gave himself the injection in the left upper thigh. Pt advised to rotate between the right and the left thigh every 14 days.  Pt would like know is it ok for him to take 100 mg weekly instead of 200 mg every 14 days. I notified pt that Dr. Ivan AnchorsHommel will be back in the office on June 15 and the question will be presented to him then. -EMH/RMA

## 2015-10-12 ENCOUNTER — Other Ambulatory Visit: Payer: Self-pay | Admitting: Family Medicine

## 2015-10-12 DIAGNOSIS — E291 Testicular hypofunction: Secondary | ICD-10-CM

## 2015-10-13 ENCOUNTER — Telehealth: Payer: Self-pay | Admitting: Family Medicine

## 2015-10-13 DIAGNOSIS — E291 Testicular hypofunction: Secondary | ICD-10-CM

## 2015-10-13 MED ORDER — TESTOSTERONE CYPIONATE 200 MG/ML IM SOLN: INTRAMUSCULAR | 0 refills | Status: DC

## 2015-10-13 NOTE — Telephone Encounter (Signed)
F/u needed for labs

## 2015-11-05 ENCOUNTER — Encounter: Payer: Self-pay | Admitting: Family Medicine

## 2015-11-05 ENCOUNTER — Ambulatory Visit (INDEPENDENT_AMBULATORY_CARE_PROVIDER_SITE_OTHER): Payer: BLUE CROSS/BLUE SHIELD | Admitting: Family Medicine

## 2015-11-05 ENCOUNTER — Ambulatory Visit: Payer: Self-pay | Admitting: Family Medicine

## 2015-11-05 ENCOUNTER — Ambulatory Visit (INDEPENDENT_AMBULATORY_CARE_PROVIDER_SITE_OTHER): Payer: BLUE CROSS/BLUE SHIELD

## 2015-11-05 VITALS — BP 128/67 | HR 54 | Wt 281.0 lb

## 2015-11-05 DIAGNOSIS — M25572 Pain in left ankle and joints of left foot: Secondary | ICD-10-CM

## 2015-11-05 DIAGNOSIS — F32A Depression, unspecified: Secondary | ICD-10-CM

## 2015-11-05 DIAGNOSIS — E291 Testicular hypofunction: Secondary | ICD-10-CM

## 2015-11-05 DIAGNOSIS — F329 Major depressive disorder, single episode, unspecified: Secondary | ICD-10-CM | POA: Diagnosis not present

## 2015-11-05 DIAGNOSIS — M79672 Pain in left foot: Secondary | ICD-10-CM | POA: Diagnosis not present

## 2015-11-05 MED ORDER — VENLAFAXINE HCL ER 75 MG PO CP24: 75.0000 mg | ORAL_CAPSULE | Freq: Every day | ORAL | 1 refills | Status: DC

## 2015-11-05 MED ORDER — TESTOSTERONE CYPIONATE 200 MG/ML IM SOLN: 200.0000 mg | INTRAMUSCULAR | 0 refills | Status: DC

## 2015-11-05 NOTE — Progress Notes (Signed)
Russell Ortega is a 40 y.o. male who presents to [facility] Ortega: Primary Care Sports Medicine today for follow-up testosterone, anxiety/depression, and discussed left ankle pain.  Testosterone: Patient was started on testosterone supplementation recently. He notes that the 100 mg weekly that he takes helps but he continues to have fatigue and decreasing libido.  Mood: Patient is a history of depression. In the past he was doing reasonably well on Zoloft. He noted that it has been less effective than been somewhat fatiguing. He has been off medicines for some time now. He's interested in starting a different medication. He continues to see a therapist. He notes significant anhedonia and a passive death wish but denies any active plan.  Left ankle pain: Patient notes new onset of left anterior ankle pain. He has pain radiating to the medial aspect of his foot and located additionally an anterior ankle and proximal dorsal foot. Pain is worse with palpation. No radiating pain weakness or numbness or injury. No treatment tried yet. Symptoms have been present for about a week.   No past medical history on file. No past surgical history on file. Social History  Substance Use Topics  . Smoking status: Never Smoker  . Smokeless tobacco: Not on file  . Alcohol use 0.0 oz/week   family history includes Breast cancer in his mother; Hyperlipidemia in his father; Hypertension in his father.  ROS as above:  Medications: Current Outpatient Prescriptions  Medication Sig Dispense Refill  . gabapentin (NEURONTIN) 300 MG capsule Take 300 mg by mouth 3 (three) times daily.    . Needle, Disp, (HYPODERMIC NEEDLE 22GX1-1/2") 22G X 1-1/2" MISC Use to administer testosterone. 100 each 0  . NEEDLE, DISP, 18 G (B-D HYPODERMIC NEEDLE 18GX1.5") 18G X 1-1/2" MISC Use to draw up testosterone. 100 each 0  . Syringe, Disposable, 1  ML MISC Use to administer testosterone. 100 each 0  . testosterone cypionate (DEPOTESTOSTERONE CYPIONATE) 200 MG/ML injection Inject 1 mL (200 mg total) into the muscle once a week. 12 mL 0  . venlafaxine XR (EFFEXOR-XR) 75 MG 24 hr capsule Take 1 capsule (75 mg total) by mouth daily with breakfast. 30 capsule 1   No current facility-administered medications for this visit.    Allergies  Allergen Reactions  . Lidocaine Nausea And Vomiting     Exam:  BP 128/67   Pulse (!) 54   Wt 281 lb (127.5 kg)   BMI 34.20 kg/m  Gen: Well NAD HEENT: EOMI,  MMM Lungs: Normal work of breathing. CTABL Heart: RRR no MRG Abd: NABS, Soft. Nondistended, Nontender Exts: Brisk capillary refill, warm and well perfused.  Left foot and ankle: Normal-appearing nontender normal motion pulses capillary refill and sensation are intact distally. Psych: Alert and oriented normal speech process and affect. No active SI or HI   Depression screen [facility] 2/9 11/05/2015  Decreased Interest 3  Down, Depressed, Hopeless 2  PHQ - 2 Score 5  Altered sleeping 3  Tired, decreased energy 2  Change in appetite 2  Feeling bad or failure about yourself  3  Trouble concentrating 3  Moving slowly or fidgety/restless 1  Suicidal thoughts 3  PHQ-9 Score 22  Difficult doing work/chores Somewhat difficult   GAD 7 : Generalized Anxiety Score 11/05/2015  Nervous, Anxious, on Edge 3  Control/stop worrying 3  Worry too much - different things 3  Trouble relaxing 3  Restless 2  Easily annoyed or irritable 3  Afraid -  awful might happen 3  Total GAD 7 Score 20  Anxiety Difficulty Somewhat difficult    Lab Results  Component Value Date   TESTOSTERONE 399 10/28/2014     No results found for this or any previous visit (from the past 24 hour(s)). No results found.    Assessment and Plan: 40 y.o. male with  Testosterone: Still symptomatic. Increase testosterone to 200 mg weekly. Recheck in 3 months.  Mood: Major  depression and anxiety. Start Effexor. Recheck in 2-3 weeks. Verbal contract for safety  Left ankle pain: Unclear etiology. X-ray pending  Orders Placed This Encounter  Procedures  . DG Ankle Complete Left    Standing Status:   Future    Number of Occurrences:   1    Standing Expiration Date:   01/04/2017    Order Specific Question:   Reason for Exam (SYMPTOM  OR DIAGNOSIS REQUIRED)    Answer:   eval pain anterior foot and ankle    Order Specific Question:   Preferred imaging location?    Answer[facility]  . DG Foot Complete Left    Standing Status:   Future    Number of Occurrences:   1    Standing Expiration Date:   01/04/2017    Order Specific Question:   Reason for Exam (SYMPTOM  OR DIAGNOSIS REQUIRED)    Answer:   eval pain anterior foot and ankle    Order Specific Question:   Preferred imaging location?    Answer[facility]    Discussed warning signs or symptoms. Please see discharge instructions. Patient expresses understanding.

## 2015-11-05 NOTE — Patient Instructions (Signed)
Thank you for coming in today. START effexor.  Increase testosterone to 200mg  every week.  Return in 2-3 weeks.   Get xray today.

## 2015-11-23 ENCOUNTER — Ambulatory Visit (INDEPENDENT_AMBULATORY_CARE_PROVIDER_SITE_OTHER): Payer: BLUE CROSS/BLUE SHIELD | Admitting: Physician Assistant

## 2015-11-23 ENCOUNTER — Encounter: Payer: Self-pay | Admitting: Physician Assistant

## 2015-11-23 ENCOUNTER — Ambulatory Visit (INDEPENDENT_AMBULATORY_CARE_PROVIDER_SITE_OTHER): Payer: BLUE CROSS/BLUE SHIELD

## 2015-11-23 VITALS — BP 120/64 | HR 54 | Ht 76.0 in | Wt 282.0 lb

## 2015-11-23 DIAGNOSIS — M5412 Radiculopathy, cervical region: Secondary | ICD-10-CM

## 2015-11-23 MED ORDER — HYDROCODONE-ACETAMINOPHEN 5-325 MG PO TABS: 1.0000 | ORAL_TABLET | Freq: Three times a day (TID) | ORAL | 0 refills | Status: DC | PRN

## 2015-11-23 MED ORDER — KETOROLAC TROMETHAMINE 60 MG/2ML IM SOLN
60.0000 mg | Freq: Once | INTRAMUSCULAR | Status: AC
  Administered 2015-11-23: 60 mg via INTRAMUSCULAR

## 2015-11-23 MED ORDER — CYCLOBENZAPRINE HCL 10 MG PO TABS: 10.0000 mg | ORAL_TABLET | Freq: Two times a day (BID) | ORAL | 1 refills | Status: DC | PRN

## 2015-11-23 MED ORDER — MELOXICAM 15 MG PO TABS: 15.0000 mg | ORAL_TABLET | Freq: Every day | ORAL | 1 refills | Status: DC

## 2015-11-23 MED ORDER — PREDNISONE 50 MG PO TABS: ORAL_TABLET | ORAL | 0 refills | Status: DC

## 2015-11-23 MED ORDER — METHYLPREDNISOLONE SODIUM SUCC 125 MG IJ SOLR
125.0000 mg | Freq: Once | INTRAMUSCULAR | Status: AC
  Administered 2015-11-23: 125 mg via INTRAMUSCULAR

## 2015-11-23 NOTE — Patient Instructions (Signed)

## 2015-11-23 NOTE — Progress Notes (Addendum)
Subjective:     Patient ID: Russell Ortega, male   DOB: 09/10/1975, 40 y.o.   MRN: [id number]  HPI The patient is a 40 y.o. Caucasian male presenting today with complaints of severe neck pain with radiation down his right arm. The patient notes that the symptoms began a week ago and have worsened with the last 3-4 days. The patient states that the pain shoots down his arm and his right hand is numb in his 4th and 5th digit. The patient notes that it is burning, sharp, tingling, and shooting sensation. The patient rates the pain as a 9-10/10 with 10 being the worst pain he has ever felt. The patient states that he was doing 80 lb weighted curls within the past week but can think of no other potential cause of injury. The patient notes that the pain is severe enough to wake him from sleep and makes working very difficult. The patient denies back pain, previous injury, fever, dizziness, syncope, shortness of breath, incontinence, chest pain, or palpitations.  Review of Systems  Constitutional: Positive for activity change. Negative for appetite change, chills, diaphoresis, fatigue, fever and unexpected weight change.  Eyes: Negative.   Respiratory: Negative for cough, chest tightness, shortness of breath and wheezing.   Cardiovascular: Negative for chest pain, palpitations and leg swelling.  Gastrointestinal: Negative.   Endocrine: Negative.   Genitourinary: Negative.   Musculoskeletal: Positive for arthralgias, myalgias, neck pain and neck stiffness. Negative for back pain, gait problem and joint swelling.  Skin: Negative.   Neurological: Positive for weakness, numbness and headaches. Negative for dizziness, syncope and light-headedness.  Psychiatric/Behavioral: Negative.       Objective:   Physical Exam  Constitutional: He is oriented to person, place, and time. He appears well-developed and well-nourished. He appears distressed.  HENT:  Head: Normocephalic and atraumatic.  Right Ear:  External ear normal.  Left Ear: External ear normal.  Nose: Nose normal.  Mouth/Throat: Oropharynx is clear and moist. No oropharyngeal exudate.  Eyes: Conjunctivae and EOM are normal. Pupils are equal, round, and reactive to light. Right eye exhibits no discharge. Left eye exhibits no discharge. No scleral icterus.  Neck: Normal range of motion. Neck supple. No JVD present. No tracheal deviation present. No thyromegaly present.  Cardiovascular: Normal rate, regular rhythm, normal heart sounds and intact distal pulses.  Exam reveals no gallop and no friction rub.   No murmur heard. Pulmonary/Chest: Effort normal and breath sounds normal. No stridor. No respiratory distress. He has no wheezes. He has no rales. He exhibits no tenderness.  Musculoskeletal: He exhibits tenderness. He exhibits no edema or deformity.       Right shoulder: He exhibits decreased range of motion, tenderness, pain and decreased strength. He exhibits no swelling, no effusion, no crepitus, no deformity, no laceration, no spasm and normal pulse.       Left shoulder: Normal.       Right elbow: He exhibits normal range of motion, no swelling, no effusion, no deformity and no laceration. No tenderness found. No radial head, no medial epicondyle, no lateral epicondyle and no olecranon process tenderness noted.       Right wrist: He exhibits normal range of motion, no tenderness, no bony tenderness, no swelling, no effusion, no crepitus, no deformity and no laceration.  Pain elicited with decompression and reduced with distraction. Negative empty can, drop arm test, Phalen's sign, or Tinel's over median nerve. Positive Tinel's sign over ulnar nerve. Tenderness to palpation over right lateral  neck, shoulder, and arm.  Lymphadenopathy:    He has no cervical adenopathy.  Neurological: He is alert and oriented to person, place, and time. No cranial nerve deficit. Coordination normal.  Skin: Skin is warm and dry. No rash noted. He is not  diaphoretic. No erythema. No pallor.  Psychiatric: He has a normal mood and affect. His behavior is normal. Judgment and thought content normal.      Assessment:     Diagnoses and all orders for this visit:  Cervical radiculopathy at C5 -     DG Cervical Spine Complete; Future  Other orders -     predniSONE (DELTASONE) 50 MG tablet; Take one tablet for 5 days. -     meloxicam (MOBIC) 15 MG tablet; Take 1 tablet (15 mg total) by mouth daily. -     cyclobenzaprine (FLEXERIL) 10 MG tablet; Take 1 tablet (10 mg total) by mouth 2 (two) times daily as needed for muscle spasms. -     HYDROcodone-acetaminophen (NORCO/VICODIN) 5-325 MG tablet; Take 1 tablet by mouth every 8 (eight) hours as needed for moderate pain.      Plan:     1. Cervical radiculopathy - Discussed with patient that secondary to his physical exam findings his symptoms are likely attributed to cervical radiculopathy. Patient to obtain cervical x-ray today. Patient given Solumedrol 125 mg and Toradol 60mg  injection in-clinic today. Patient to start on oral prednisone 50mg  tablets for 5 days, Mobic 15 mg tablets, Flexeril 10mg  tablets, and Narco/Vicoden as needed for pain. Patient to keep appointment and follow-up with Dr. Denyse Amass in sports medicine on Friday.  Addendum:Degenerative disc disease at C5-6 with disc space narrowing and spurring. Prevertebral soft tissues are normal. Alignment is normal. No fracture. Mild bilateral neural foraminal narrowing at C5-6 due to uncovertebral spurring.

## 2015-11-26 ENCOUNTER — Encounter: Payer: Self-pay | Admitting: Family Medicine

## 2015-11-26 ENCOUNTER — Ambulatory Visit (INDEPENDENT_AMBULATORY_CARE_PROVIDER_SITE_OTHER): Payer: BLUE CROSS/BLUE SHIELD | Admitting: Family Medicine

## 2015-11-26 VITALS — BP 134/82 | HR 58 | Wt 283.0 lb

## 2015-11-26 DIAGNOSIS — M5412 Radiculopathy, cervical region: Secondary | ICD-10-CM

## 2015-11-26 MED ORDER — HYDROCODONE-ACETAMINOPHEN 5-325 MG PO TABS: 1.0000 | ORAL_TABLET | Freq: Three times a day (TID) | ORAL | 0 refills | Status: DC | PRN

## 2015-11-26 MED ORDER — CYCLOBENZAPRINE HCL 10 MG PO TABS: 10.0000 mg | ORAL_TABLET | Freq: Two times a day (BID) | ORAL | 1 refills | Status: DC | PRN

## 2015-11-26 NOTE — Patient Instructions (Signed)
Thank you for coming in today. You should hear about the MRI soon,.  Return a few days after the MRI.  Take 2 gabapentin at night.    Cervical Radiculopathy Cervical radiculopathy happens when a nerve in the neck (cervical nerve) is pinched or bruised. This condition can develop because of an injury or as part of the normal aging process. Pressure on the cervical nerves can cause pain or numbness that runs from the neck all the way down into the arm and fingers. Usually, this condition gets better with rest. Treatment may be needed if the condition does not improve.  CAUSES This condition may be caused by:  Injury.  Slipped (herniated) disk.  Muscle tightness in the neck because of overuse.  Arthritis.  Breakdown or degeneration in the bones and joints of the spine (spondylosis) due to aging.  Bone spurs that may develop near the cervical nerves. SYMPTOMS Symptoms of this condition include:  Pain that runs from the neck to the arm and hand. The pain can be severe or irritating. It may be worse when the neck is moved.  Numbness or weakness in the affected arm and hand. DIAGNOSIS This condition may be diagnosed based on symptoms, medical history, and a physical exam. You may also have tests, including:  X-rays.  CT scan.  MRI.  Electromyogram (EMG).  Nerve conduction tests. TREATMENT In many cases, treatment is not needed for this condition. With rest, the condition usually gets better over time. If treatment is needed, options may include:  Wearing a soft neck collar for short periods of time.  Physical therapy to strengthen your neck muscles.  Medicines, such as NSAIDs, oral corticosteroids, or spinal injections.  Surgery. This may be needed if other treatments do not help. Various types of surgery may be done depending on the cause of your problems. HOME CARE INSTRUCTIONS Managing Pain  Take over-the-counter and prescription medicines only as told by your health  care provider.  If directed, apply ice to the affected area.  Put ice in a plastic bag.  Place a towel between your skin and the bag.  Leave the ice on for 20 minutes, 2-3 times per day.  If ice does not help, you can try using heat. Take a warm shower or warm bath, or use a heat pack as told by your health care provider.  Try a gentle neck and shoulder massage to help relieve symptoms. Activity  Rest as needed. Follow instructions from your health care provider about any restrictions on activities.  Do stretching and strengthening exercises as told by your health care provider or physical therapist. General Instructions  If you were given a soft collar, wear it as told by your health care provider.  Use a flat pillow when you sleep.  Keep all follow-up visits as told by your health care provider. This is important. SEEK MEDICAL CARE IF:  Your condition does not improve with treatment. SEEK IMMEDIATE MEDICAL CARE IF:  Your pain gets much worse and cannot be controlled with medicines.  You have weakness or numbness in your hand, arm, face, or leg.  You have a high fever.  You have a stiff, rigid neck.  You lose control of your bowels or your bladder (have incontinence).  You have trouble with walking, balance, or speaking.   This information is not intended to replace advice given to you by your health care provider. Make sure you discuss any questions you have with your health care provider.  Document Released: 11/01/2000 Document Revised: 10/28/2014 Document Reviewed: 04/02/2014 Elsevier Interactive Patient Education [employer]

## 2015-11-26 NOTE — Progress Notes (Signed)
Russell Ortega is a 40 y.o. male who [name] Primary Care Sports Medicine today for right cervical radiculopathy. Patient in the last several days developed significant right pain radiating from his neck to his thumb on the right arm. Pain is worse when he rotates his head or tilts his head to the right. He denies any injury. He notes worsening numbness and new mild weakness. He's been treated with prednisone gabapentin and Norco which have not helped much at all. Pain ranges for moderate to severe and is interfering with his ability to work and to exercise.   No past medical history on file. No past surgical history on file. Social History  Substance Use Topics  . Smoking status: Never Smoker  . Smokeless tobacco: Not on file  . Alcohol use 0.0 oz/week   family history includes Breast cancer in his mother; Hyperlipidemia in his father; Hypertension in his father.  ROS as above:  Medications: Current Outpatient Prescriptions  Medication Sig Dispense Refill  . cyclobenzaprine (FLEXERIL) 10 MG tablet Take 1 tablet (10 mg total) by mouth 2 (two) times daily as needed for muscle spasms. 30 tablet 1  . gabapentin (NEURONTIN) 300 MG capsule Take 300 mg by mouth 3 (three) times daily.    Marland Kitchen. HYDROcodone-acetaminophen (NORCO/VICODIN) 5-325 MG tablet Take 1 tablet by mouth every 8 (eight) hours as needed for moderate pain. 15 tablet 0  . meloxicam (MOBIC) 15 MG tablet Take 1 tablet (15 mg total) by mouth daily. 30 tablet 1  . Needle, Disp, (HYPODERMIC NEEDLE 22GX1-1/2") 22G X 1-1/2" MISC Use to administer testosterone. 100 each 0  . NEEDLE, DISP, 18 G (B-D HYPODERMIC NEEDLE 18GX1.5") 18G X 1-1/2" MISC Use to draw up testosterone. 100 each 0  . predniSONE (DELTASONE) 50 MG tablet Take one tablet for 5 days. 5 tablet 0  . Syringe, Disposable, 1 ML MISC Use to administer testosterone. 100 each  0  . testosterone cypionate (DEPOTESTOSTERONE CYPIONATE) 200 MG/ML injection Inject 1 mL (200 mg total) into the muscle once a week. 12 mL 0  . venlafaxine XR (EFFEXOR-XR) 75 MG 24 hr capsule Take 1 capsule (75 mg total) by mouth daily with breakfast. 30 capsule 1   No current facility-administered medications for this visit.    Allergies  Allergen Reactions  . Lidocaine Nausea And Vomiting     Exam:  BP 134/82   Pulse (!) 58   Wt 283 lb (128.4 kg)   BMI 34.45 kg/m  Gen: Well NAD C-spine: Nontender to midline. Range of motion is decreased in right lateral rotation and right lateral flexion due to radiating pain with a positive right-sided Spurling's test. Upper extremity strength is grossly intact to my exam however patient notes subjective right hand weakness. Sensation is intact as well. Pulses capillary refill are intact.  Dg Cervical Spine Complete  Result Date: 11/23/2015 CLINICAL DATA:  Cervical radiculopathy. Pain radiating down right arm for 1 week. EXAM: CERVICAL SPINE - COMPLETE 4+ VIEW COMPARISON:  None. FINDINGS: Degenerative disc disease at C5-6 with disc space narrowing and spurring. Prevertebral soft tissues are normal. Alignment is normal. No fracture. Mild bilateral neural foraminal narrowing at C5-6 due to uncovertebral spurring. IMPRESSION: Degenerative changes as above.  No acute findings. Electronically Signed   By: Charlett NoseKevin  Dover M.D.   On: 11/23/2015 12:19     Assessment and Plan: 40 y.o. male with right cervical radiculopathy. Likely C6 nerve root. Patient has failed  conservative management and is worsening with new onset of weakness. Plan for MRI in the near future for further intervention planning. Follow-up after MRI.   Orders Placed This Encounter  Procedures  . MR Cervical Spine Wo Contrast    Standing Status:   Future    Standing Expiration Date:   01/25/2017    Order Specific Question:   Reason for Exam (SYMPTOM  OR DIAGNOSIS REQUIRED)    Answer:    Suspect C6 rt radiculopathy    Order Specific Question:   Preferred imaging location?    Answer:   [facility] (table limit-350lbs)    Order Specific Question:   What is the patient's sedation requirement?    Answer:   No Sedation    Order Specific Question:   Does the patient have a pacemaker or implanted devices?    Answer:   No    Discussed warning signs or symptoms. Please see discharge instructions. Patient expresses understanding.

## 2015-11-29 ENCOUNTER — Ambulatory Visit (INDEPENDENT_AMBULATORY_CARE_PROVIDER_SITE_OTHER): Payer: BLUE CROSS/BLUE SHIELD

## 2015-11-29 DIAGNOSIS — M47892 Other spondylosis, cervical region: Secondary | ICD-10-CM | POA: Diagnosis not present

## 2015-11-29 DIAGNOSIS — M4802 Spinal stenosis, cervical region: Secondary | ICD-10-CM | POA: Diagnosis not present

## 2015-11-29 DIAGNOSIS — M50323 Other cervical disc degeneration at C6-C7 level: Secondary | ICD-10-CM

## 2015-11-29 DIAGNOSIS — M5412 Radiculopathy, cervical region: Secondary | ICD-10-CM

## 2015-11-30 ENCOUNTER — Encounter: Payer: Self-pay | Admitting: Family Medicine

## 2015-11-30 ENCOUNTER — Ambulatory Visit (INDEPENDENT_AMBULATORY_CARE_PROVIDER_SITE_OTHER): Payer: BLUE CROSS/BLUE SHIELD | Admitting: Family Medicine

## 2015-11-30 VITALS — BP 129/76 | HR 66

## 2015-11-30 DIAGNOSIS — M5412 Radiculopathy, cervical region: Secondary | ICD-10-CM

## 2015-11-30 DIAGNOSIS — E291 Testicular hypofunction: Secondary | ICD-10-CM

## 2015-11-30 NOTE — Progress Notes (Signed)
Russell Ortega is a 40 y.o. male who [name]: Primary Care Sports Medicine today for follow-up MRI. Patient returns to clinic today for follow-up cervical radiculopathy. Patient continues to complain of pain radiating to his right hand. He notes following steroid Dosepak he has had no significant benefit. He denies any new weakness or numbness.   No past medical history on file. No past surgical history on file. Social History  Substance Use Topics  . Smoking status: Never Smoker  . Smokeless tobacco: Not on file  . Alcohol use 0.0 oz/week   family history includes Breast cancer in his mother; Hyperlipidemia in his father; Hypertension in his father.  ROS as above:  Medications: Current Outpatient Prescriptions  Medication Sig Dispense Refill  . cyclobenzaprine (FLEXERIL) 10 MG tablet Take 1 tablet (10 mg total) by mouth 2 (two) times daily as needed for muscle spasms. 30 tablet 1  . gabapentin (NEURONTIN) 300 MG capsule Take 300 mg by mouth 3 (three) times daily.    Marland Kitchen. HYDROcodone-acetaminophen (NORCO/VICODIN) 5-325 MG tablet Take 1 tablet by mouth every 8 (eight) hours as needed for moderate pain. 15 tablet 0  . meloxicam (MOBIC) 15 MG tablet Take 1 tablet (15 mg total) by mouth daily. 30 tablet 1  . Needle, Disp, (HYPODERMIC NEEDLE 22GX1-1/2") 22G X 1-1/2" MISC Use to administer testosterone. 100 each 0  . NEEDLE, DISP, 18 G (B-D HYPODERMIC NEEDLE 18GX1.5") 18G X 1-1/2" MISC Use to draw up testosterone. 100 each 0  . predniSONE (DELTASONE) 50 MG tablet Take one tablet for 5 days. 5 tablet 0  . Syringe, Disposable, 1 ML MISC Use to administer testosterone. 100 each 0  . testosterone cypionate (DEPOTESTOSTERONE CYPIONATE) 200 MG/ML injection Inject 1 mL (200 mg total) into the muscle once a week. 12 mL 0  . venlafaxine XR (EFFEXOR-XR) 75 MG 24 hr capsule Take 1 capsule (75 mg  total) by mouth daily with breakfast. 30 capsule 1   No current facility-administered medications for this visit.    Allergies  Allergen Reactions  . Lidocaine Nausea And Vomiting     Exam:  BP 129/76   Pulse 66  Gen: Well NAD C-spine: Nontender normal motion. Upper extremity strength is intact bilaterally.  No results found for this or any previous visit (from the past 24 hour(s)). Mr Cervical Spine Wo Contrast  Result Date: 11/29/2015 CLINICAL DATA:  Neck pain.  Right arm pain with hand numbness. EXAM: MRI CERVICAL SPINE WITHOUT CONTRAST TECHNIQUE: Multiplanar, multisequence MR imaging of the cervical spine was performed. No intravenous contrast was administered. COMPARISON:  10/3 size 17 radiograph FINDINGS: Alignment: Unremarkable Vertebrae: Type 1 degenerative endplate findings at C5-6 with associated disc desiccation and mild loss of disc height. Intervertebral spurring at this level. There is a lesser degree of disc desiccation at C2-3 and C4-5. No significant vertebral marrow edema is identified. Mildly congenitally short pedicles in the cervical spine. Cord: No significant abnormal spinal cord signal is observed. Posterior Fossa, vertebral arteries, paraspinal tissues: Unremarkable Disc levels: C2-3: Borderline central narrowing of the thecal sac due to short pedicles. Minimal right uncinate spurring. C3-4: Moderate right and mild left foraminal stenosis and mild central narrowing of the thecal sac due to uncinate spurring, disc bulge, and short pedicles. C4-5: Borderline central narrowing of the thecal sac and borderline bilateral foraminal stenosis due to disc bulge, uncinate spurring, and short pedicles. C5-6: Moderate bilateral foraminal stenosis and moderate central narrowing of  the thecal sac due to short pedicles, disc osteophyte complex, and uncinate spurring. C6-7: Moderate to prominent right and mild to moderate left foraminal stenosis due to foraminal disc protrusions, uncinate  spurring, and short pedicles. C7-T1: Mild left foraminal stenosis due to a small focal left foraminal disc protrusion. IMPRESSION: 1. 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 Electronically Signed   By: Gaylyn Rong M.D.   On: 11/29/2015 09:10      Assessment and Plan: 40 y.o. male with right cervical radicular pain very likely right C6 nerve root based on location of pain. MRI certainly supports this diagnosis. Plan for epidural steroid injection with prompt follow-up.  Additionally we'll check fasting labs in preparation for a follow-up visit in the near future.   Orders Placed This Encounter  Procedures  . DG Epidurography    Order Specific Question:   Reason for Exam (SYMPTOM  OR DIAGNOSIS REQUIRED)    Answer:   Rt C6    Order Specific Question:   Preferred imaging location?    Answer:   [facility]  . CBC  . COMPLETE METABOLIC PANEL WITH GFR  . Hemoglobin A1c  . Lipid panel  . TSH  . VITAMIN D 25 Hydroxy (Vit-D Deficiency, Fractures)  . Testosterone  . PSA    Discussed warning signs or symptoms. Please see discharge instructions. Patient expresses understanding.

## 2015-11-30 NOTE — Patient Instructions (Addendum)
Thank you for coming in today. You should hear about the injection soon.  Return a few weeks after the injection or sooner if needed.  Come back or go to the emergency room if you notice new weakness new numbness problems walking or bowel or bladder problems.   Ger fasting labs between injection in the morning to follow up labs.

## 2015-12-02 ENCOUNTER — Telehealth: Payer: Self-pay

## 2015-12-02 NOTE — Telephone Encounter (Signed)
Pt left two VM questioning the status of his order for epidural injection. Unable to reach pt when calls were returned. Pt has an appointment scheduled with [facility] on 12/13/2015.

## 2015-12-03 LAB — LIPID PANEL
Cholesterol: 118 mg/dL — ABNORMAL LOW (ref 125–200)
HDL: 45 mg/dL (ref >=40)
LDL Cholesterol: 46 mg/dL (ref <130)
TRIGLYCERIDES: 134 mg/dL (ref <150)
Total CHOL/HDL Ratio: 2.6 Ratio (ref <=5.0)
VLDL: 27 mg/dL (ref <30)

## 2015-12-03 LAB — COMPLETE METABOLIC PANEL WITH GFR
ALBUMIN: 4.2 g/dL (ref 3.6–5.1)
ALT: 31 U/L (ref 9–46)
AST: 17 U/L (ref 10–40)
Alkaline Phosphatase: 61 U/L (ref 40–115)
BILIRUBIN TOTAL: 0.4 mg/dL (ref 0.2–1.2)
BUN: 16 mg/dL (ref 7–25)
CALCIUM: 9.1 mg/dL (ref 8.6–10.3)
CO2: 24 mmol/L (ref 20–31)
CREATININE: 0.92 mg/dL (ref 0.60–1.35)
Chloride: 103 mmol/L (ref 98–110)
GFR, Est Non African American: >89 mL/min (ref >=60)
Glucose, Bld: 93 mg/dL (ref 65–99)
Potassium: 4.8 mmol/L (ref 3.5–5.3)
Sodium: 139 mmol/L (ref 135–146)
TOTAL PROTEIN: 6.5 g/dL (ref 6.1–8.1)

## 2015-12-03 LAB — CBC
HEMATOCRIT: 45.8 % (ref 38.5–50.0)
HEMOGLOBIN: 15.6 g/dL (ref 13.2–17.1)
MCH: 30.1 pg (ref 27.0–33.0)
MCHC: 34.1 g/dL (ref 32.0–36.0)
MCV: 88.2 fL (ref 80.0–100.0)
MPV: 9.5 fL (ref 7.5–12.5)
Platelets: 199 10*3/uL (ref 140–400)
RBC: 5.19 MIL/uL (ref 4.20–5.80)
RDW: 13.2 % (ref 11.0–15.0)
WBC: 8.1 10*3/uL (ref 3.8–10.8)

## 2015-12-03 LAB — HEMOGLOBIN A1C
HEMOGLOBIN A1C: 5.3 % (ref <5.7)
MEAN PLASMA GLUCOSE: 105 mg/dL

## 2015-12-03 LAB — TSH: TSH: 1.35 m[IU]/L (ref 0.40–4.50)

## 2015-12-03 LAB — PSA: PSA: 1.1 ng/mL (ref <=4.0)

## 2015-12-04 LAB — TESTOSTERONE: TESTOSTERONE: 620 ng/dL (ref 250–827)

## 2015-12-04 LAB — VITAMIN D 25 HYDROXY (VIT D DEFICIENCY, FRACTURES): Vit D, 25-Hydroxy: 36 ng/mL (ref 30–100)

## 2015-12-13 ENCOUNTER — Ambulatory Visit
Admission: RE | Admit: 2015-12-13 | Discharge: 2015-12-13 | Disposition: A | Discharge status: Home / Self Care | Payer: BLUE CROSS/BLUE SHIELD | Source: Ambulatory Visit | Attending: Family Medicine | Admitting: Family Medicine

## 2015-12-13 DIAGNOSIS — M50223 Other cervical disc displacement at C6-C7 level: Secondary | ICD-10-CM | POA: Diagnosis not present

## 2015-12-13 MED ORDER — TRIAMCINOLONE ACETONIDE 40 MG/ML IJ SUSP (RADIOLOGY)
60.0000 mg | Freq: Once | INTRAMUSCULAR | Status: AC
  Administered 2015-12-13: 60 mg via EPIDURAL

## 2015-12-13 MED ORDER — IOPAMIDOL (ISOVUE-M 300) INJECTION 61%
1.0000 mL | Freq: Once | INTRAMUSCULAR | Status: AC | PRN
Start: 2015-12-13 — End: 2015-12-13
  Administered 2015-12-13: 1 mL via EPIDURAL

## 2015-12-13 NOTE — Discharge Instructions (Signed)

## 2015-12-23 DIAGNOSIS — M5136 Other intervertebral disc degeneration, lumbar region: Secondary | ICD-10-CM | POA: Diagnosis not present

## 2015-12-23 DIAGNOSIS — M4712 Other spondylosis with myelopathy, cervical region: Secondary | ICD-10-CM | POA: Diagnosis not present

## 2015-12-24 ENCOUNTER — Ambulatory Visit (INDEPENDENT_AMBULATORY_CARE_PROVIDER_SITE_OTHER): Payer: BLUE CROSS/BLUE SHIELD | Admitting: Family Medicine

## 2015-12-24 VITALS — BP 130/62 | HR 48 | Wt 276.0 lb

## 2015-12-24 DIAGNOSIS — M5412 Radiculopathy, cervical region: Secondary | ICD-10-CM | POA: Diagnosis not present

## 2015-12-24 NOTE — Progress Notes (Signed)
Russell Ortega is a 40 y.o. male who [name] today for right arm radicular symptoms. Patient is been seen several times for right cervical radiculopathy treated with epidural steroid injection. He notes since the epidural steroid injection his pain has improved dramatically. However he notes weakness especially with elbow extension since. He has incidentally been seen by his neurosurgeon for follow-up back surgery. He brought up the right arm weakness with the neurosurgeon who is currently in the process of working him up. He notes the weakness has not worsened over the last several days. He denies any other symptoms and feels better overall.   No past medical history on file. No past surgical history on file. Social History  Substance Use Topics  . Smoking status: Never Smoker  . Smokeless tobacco: Not on file  . Alcohol use 0.0 oz/week   family history includes Breast cancer in his mother; Hyperlipidemia in his father; Hypertension in his father.  ROS as above:  Medications: Current Outpatient Prescriptions  Medication Sig Dispense Refill  . cyclobenzaprine (FLEXERIL) 10 MG tablet Take 1 tablet (10 mg total) by mouth 2 (two) times daily as needed for muscle spasms. 30 tablet 1  . gabapentin (NEURONTIN) 300 MG capsule Take 300 mg by mouth 3 (three) times daily.    Marland Kitchen. HYDROcodone-acetaminophen (NORCO/VICODIN) 5-325 MG tablet Take 1 tablet by mouth every 8 (eight) hours as needed for moderate pain. 15 tablet 0  . meloxicam (MOBIC) 15 MG tablet Take 1 tablet (15 mg total) by mouth daily. 30 tablet 1  . Needle, Disp, (HYPODERMIC NEEDLE 22GX1-1/2") 22G X 1-1/2" MISC Use to administer testosterone. 100 each 0  . NEEDLE, DISP, 18 G (B-D HYPODERMIC NEEDLE 18GX1.5") 18G X 1-1/2" MISC Use to draw up testosterone. 100 each 0  . Syringe, Disposable, 1 ML MISC Use  to administer testosterone. 100 each 0  . testosterone cypionate (DEPOTESTOSTERONE CYPIONATE) 200 MG/ML injection Inject 1 mL (200 mg total) into the muscle once a week. 12 mL 0  . venlafaxine XR (EFFEXOR-XR) 75 MG 24 hr capsule Take 1 capsule (75 mg total) by mouth daily with breakfast. 30 capsule 1   No current facility-administered medications for this visit.    Allergies  Allergen Reactions  . Lidocaine Nausea And Vomiting    Can tolerate Tetracaine  . Tramadol Rash    Health Maintenance Health Maintenance  Topic Date Due  . HIV Screening  05/21/1989  . INFLUENZA VACCINE  09/21/2015  . TETANUS/TDAP  03/20/2024     Exam:  BP 130/62   Pulse (!) 48   Wt 276 lb (125.2 kg)   BMI 33.60 kg/m  Gen: Well NAD Neck: Nontender to C-spine. Normal neck motion. Upper extremity strength and sensation is completely intact with exception of elbow extension and mild subjective decreased sensation at the tip of the thumb and index finger of the right hand.     No results found for this or any previous visit (from the past 72 hour(s)). No results found.    Assessment and Plan: 40 y.o. male with new right arm weakness following epidural steroid injection with overall improved symptoms. This is obviously concerning however patient seems to be doing a lot better and his weakness has not worsened. Plan for a bit of watchful waiting. Patient has a scheduled follow-up appointment with his neurosurgeon in the near future. If symptoms worsen he will let me know and we'll  obtain a stat MRI of the C-spine.   No orders of the defined types were placed in this encounter.   Discussed warning signs or symptoms. Please see discharge instructions. Patient expresses understanding.

## 2015-12-24 NOTE — Patient Instructions (Signed)
Thank you for coming in today. Get your CD from the xray office.  Return as needed.  Follow up with your neurosurgeon as arranged.  Let me know if you worsen.   Come back or go to the emergency room if you notice new weakness new numbness problems walking or bowel or bladder problems.

## 2015-12-28 ENCOUNTER — Other Ambulatory Visit: Payer: Self-pay | Admitting: Family Medicine

## 2016-01-22 ENCOUNTER — Other Ambulatory Visit: Payer: Self-pay | Admitting: Physician Assistant

## 2016-01-22 ENCOUNTER — Other Ambulatory Visit: Payer: Self-pay | Admitting: Family Medicine

## 2016-02-03 DIAGNOSIS — M4712 Other spondylosis with myelopathy, cervical region: Secondary | ICD-10-CM | POA: Diagnosis not present

## 2016-02-07 ENCOUNTER — Telehealth: Payer: Self-pay

## 2016-02-07 DIAGNOSIS — G4733 Obstructive sleep apnea (adult) (pediatric): Secondary | ICD-10-CM | POA: Diagnosis not present

## 2016-02-07 NOTE — Telephone Encounter (Signed)
Pt called requesting a status update regarding prior auth for testosterone. Please advise. Will inform pt via mychart.

## 2016-02-09 NOTE — Telephone Encounter (Signed)
Russell Ortega,    Andrea did a prior auth for Testosterone back in May and it was approved from April 2017 to Jul 19, 2016. Case ID is [id number]. - CF

## 2016-02-28 ENCOUNTER — Other Ambulatory Visit: Payer: Self-pay | Admitting: Family Medicine

## 2016-03-30 DIAGNOSIS — M4712 Other spondylosis with myelopathy, cervical region: Secondary | ICD-10-CM | POA: Diagnosis not present

## 2016-04-27 ENCOUNTER — Other Ambulatory Visit: Payer: Self-pay | Admitting: Family Medicine

## 2016-04-27 DIAGNOSIS — E291 Testicular hypofunction: Secondary | ICD-10-CM

## 2016-05-08 DIAGNOSIS — G4733 Obstructive sleep apnea (adult) (pediatric): Secondary | ICD-10-CM | POA: Diagnosis not present

## 2016-06-25 ENCOUNTER — Other Ambulatory Visit: Payer: Self-pay | Admitting: Family Medicine

## 2016-07-03 ENCOUNTER — Telehealth: Payer: Self-pay | Admitting: Family Medicine

## 2016-07-03 DIAGNOSIS — E291 Testicular hypofunction: Secondary | ICD-10-CM

## 2016-07-03 NOTE — Telephone Encounter (Signed)
Pt needs to have testosterone rechecked before PA for testosterone can be re-submitted to insurance. Lab order placed.

## 2016-07-06 ENCOUNTER — Ambulatory Visit (INDEPENDENT_AMBULATORY_CARE_PROVIDER_SITE_OTHER): Payer: BLUE CROSS/BLUE SHIELD | Admitting: Family Medicine

## 2016-07-06 ENCOUNTER — Encounter: Payer: Self-pay | Admitting: Family Medicine

## 2016-07-06 VITALS — BP 135/71 | HR 53 | Temp 98.5°F | Wt 268.0 lb

## 2016-07-06 DIAGNOSIS — E291 Testicular hypofunction: Secondary | ICD-10-CM

## 2016-07-06 DIAGNOSIS — M5412 Radiculopathy, cervical region: Secondary | ICD-10-CM

## 2016-07-06 MED ORDER — VENLAFAXINE HCL ER 75 MG PO CP24: 75.0000 mg | ORAL_CAPSULE | Freq: Every day | ORAL | 1 refills | Status: DC

## 2016-07-06 NOTE — Progress Notes (Signed)
Here for follow up 6 month check.

## 2016-07-06 NOTE — Progress Notes (Signed)
       Russell Ortega is a 41 y.o. male who [facility] today for follow-up testosterone and right-sided cervical radiculopathy.  Testosterone is doing well. Patient notes improved libido and energy. He takes the testosterone every 10 days. He feels well no fevers chills nausea vomiting or diarrhea.  Cervical radiculopathy status post decompression doing well. He initially had weakness in his right arm which is now improving.   Past Medical History:  Diagnosis Date  . Hypogonadism in male 06/26/2014   No past surgical history on file. Social History  Substance Use Topics  . Smoking status: Never Smoker  . Smokeless tobacco: Not on file  . Alcohol use 0.0 oz/week   family history includes Breast cancer in his mother; Hyperlipidemia in his father; Hypertension in his father.  ROS as above:  Medications: Current Outpatient Prescriptions  Medication Sig Dispense Refill  . Needle, Disp, (HYPODERMIC NEEDLE 22GX1-1/2") 22G X 1-1/2" MISC Use to administer testosterone. 100 each 0  . NEEDLE, DISP, 18 G (B-D HYPODERMIC NEEDLE 18GX1.5") 18G X 1-1/2" MISC Use to draw up testosterone. 100 each 0  . Syringe, Disposable, 1 ML MISC Use to administer testosterone. 100 each 0  . testosterone cypionate (DEPOTESTOSTERONE CYPIONATE) 200 MG/ML injection INJECT 1 ML IN THE MUSCLE EVERY WEEK 12 mL 0  . venlafaxine XR (EFFEXOR-XR) 75 MG 24 hr capsule Take 1 capsule (75 mg total) by mouth daily with breakfast. 90 capsule 1   No current facility-administered medications for this visit.    Allergies  Allergen Reactions  . Lidocaine Nausea And Vomiting    Can tolerate Tetracaine  . Tramadol Rash    Health Maintenance Health Maintenance  Topic Date Due  . HIV Screening  05/21/1989  . INFLUENZA VACCINE  09/20/2016  . TETANUS/TDAP  03/20/2024     Exam:  BP 135/71 (BP  Location: Left Arm, Patient Position: Sitting, Cuff Size: Large)   Pulse (!) 53   Temp 98.5 F (36.9 C) (Oral)   Wt 268 lb (121.6 kg)   SpO2 98%   BMI 32.62 kg/m  Gen: Well NAD HEENT: EOMI,  MMM Lungs: Normal work of breathing. CTABL Heart: RRR no MRG Abd: NABS, Soft. Nondistended, Nontender Exts: Brisk capillary refill, warm and well perfused.  Neuro: No obvious discrepancy between right and left side upper extremity strength. However patient is heavily muscled.   No results found for this or any previous visit (from the past 72 hour(s)). No results found.    Assessment and Plan: 41 y.o. male with  Low testosterone doing well. We'll check fasting testosterone labs in the near future.  Cervical radicular symptoms much improved continue to follow along.   Orders Placed This Encounter  Procedures  . CBC  . COMPLETE METABOLIC PANEL WITH GFR  . Lipid Panel w/reflex Direct LDL  . PSA   Meds ordered this encounter  Medications  . venlafaxine XR (EFFEXOR-XR) 75 MG 24 hr capsule    Sig: Take 1 capsule (75 mg total) by mouth daily with breakfast.    Dispense:  90 capsule    Refill:  1     Discussed warning signs or symptoms. Please see discharge instructions. Patient expresses understanding.

## 2016-07-06 NOTE — Patient Instructions (Signed)
Thank you for coming in today. Get fasting labs between injections.  Recheck in 6 months or sooner if needed.   Testosterone injection What is this medicine? TESTOSTERONE (tes TOS ter one) is the main male hormone. It supports normal male development such as muscle growth, facial hair, and deep voice. It is used in males to treat low testosterone levels. This medicine may be used for other purposes; ask your health care provider or pharmacist if you have questions. COMMON BRAND NAME(S): Andro-L.A., Aveed, Delatestryl, Depo-Testosterone, Virilon What should I tell my health care provider before I take this medicine? They need to know if you have any of these conditions: -cancer -diabetes -heart disease -kidney disease -liver disease -lung disease -prostate disease -an unusual or allergic reaction to testosterone, other medicines, foods, dyes, or preservatives -pregnant or trying to get pregnant -breast-feeding How should I use this medicine? This medicine is for injection into a muscle. It is usually given by a health care professional in a hospital or clinic setting. Contact your pediatrician regarding the use of this medicine in children. While this medicine may be prescribed for children as young as 33 years of age for selected conditions, precautions do apply. Overdosage: If you think you have taken too much of this medicine contact a poison control center or emergency room at once. NOTE: This medicine is only for you. Do not share this medicine with others. What if I miss a dose? Try not to miss a dose. Your doctor or health care professional will tell you when your next injection is due. Notify the office if you are unable to keep an appointment. What may interact with this medicine? -medicines for diabetes -medicines that treat or prevent blood clots like warfarin -oxyphenbutazone -propranolol -steroid medicines like prednisone or cortisone This list may not describe all  possible interactions. Give your health care provider a list of all the medicines, herbs, non-prescription drugs, or dietary supplements you use. Also tell them if you smoke, drink alcohol, or use illegal drugs. Some items may interact with your medicine. What should I watch for while using this medicine? Visit your doctor or health care professional for regular checks on your progress. They will need to check the level of testosterone in your blood. This medicine is only approved for use in men who have low levels of testosterone related to certain medical conditions. Heart attacks and strokes have been reported with the use of this medicine. Notify your doctor or health care professional and seek emergency treatment if you develop breathing problems; changes in vision; confusion; chest pain or chest tightness; sudden arm pain; severe, sudden headache; trouble speaking or understanding; sudden numbness or weakness of the face, arm or leg; loss of balance or coordination. Talk to your doctor about the risks and benefits of this medicine. This medicine may affect blood sugar levels. If you have diabetes, check with your doctor or health care professional before you change your diet or the dose of your diabetic medicine. Testosterone injections are not commonly used in women. Women should inform their doctor if they wish to become pregnant or think they might be pregnant. There is a potential for serious side effects to an unborn child. Talk to your health care professional or pharmacist for more information. Talk with your doctor or health care professional about your birth control options while taking this medicine. This drug is banned from use in athletes by most athletic organizations. What side effects may I notice from receiving this medicine?  Side effects that you should report to your doctor or health care professional as soon as possible: -allergic reactions like skin rash, itching or hives, swelling  of the face, lips, or tongue -breast enlargement -breathing problems -changes in emotions or moods -deep or hoarse voice -irregular menstrual periods -signs and symptoms of liver injury like dark yellow or brown urine; general ill feeling or flu-like symptoms; light-colored stools; loss of appetite; nausea; right upper belly pain; unusually weak or tired; yellowing of the eyes or skin -stomach pain -swelling of the ankles, feet, hands -too frequent or persistent erections -trouble passing urine or change in the amount of urine Side effects that usually do not require medical attention (report to your doctor or health care professional if they continue or are bothersome): -acne -change in sex drive or performance -facial hair growth -hair loss -headache This list may not describe all possible side effects. Call your doctor for medical advice about side effects. You may report side effects to FDA at 1-800-FDA-1088. Where should I keep my medicine? Keep out of the reach of children. This medicine can be abused. Keep your medicine in a safe place to protect it from theft. Do not share this medicine with anyone. Selling or giving away this medicine is dangerous and against the law. Store at room temperature between 20 and 25 degrees C (68 and 77 degrees F). Do not freeze. Protect from light. Follow the directions for the product you are prescribed. Throw away any unused medicine after the expiration date. NOTE: This sheet is a summary. It may not cover all possible information. If you have questions about this medicine, talk to your doctor, pharmacist, or health care provider.  2018 Elsevier/Gold Standard (2015-03-13 07:33:55)

## 2016-07-10 ENCOUNTER — Telehealth: Payer: Self-pay | Admitting: Family Medicine

## 2016-07-10 NOTE — Telephone Encounter (Signed)
Can I send and updated script for patient's testosterone?  Please advise.

## 2016-07-10 NOTE — Telephone Encounter (Signed)
yes

## 2016-07-10 NOTE — Telephone Encounter (Signed)
Received PA for Testosterone Cypionate sent through cover my meds and received approval. I will call pharmacy and patient.   Case ID: [id number] Approved 06/10/2016 - 07/10/2017

## 2016-07-11 ENCOUNTER — Other Ambulatory Visit: Payer: Self-pay

## 2016-07-11 DIAGNOSIS — E291 Testicular hypofunction: Secondary | ICD-10-CM

## 2016-07-11 MED ORDER — TESTOSTERONE CYPIONATE 200 MG/ML IM SOLN: INTRAMUSCULAR | 0 refills | Status: DC

## 2016-07-11 NOTE — Telephone Encounter (Signed)
rx written and placed in box for signing.

## 2016-07-18 DIAGNOSIS — E291 Testicular hypofunction: Secondary | ICD-10-CM | POA: Diagnosis not present

## 2016-07-19 LAB — CBC
HCT: 46.7 % (ref 38.5–50.0)
Hemoglobin: 15.5 g/dL (ref 13.2–17.1)
MCH: 30.2 pg (ref 27.0–33.0)
MCHC: 33.2 g/dL (ref 32.0–36.0)
MCV: 91 fL (ref 80.0–100.0)
MPV: 11.5 fL (ref 7.5–12.5)
Platelets: 182 10*3/uL (ref 140–400)
RBC: 5.13 MIL/uL (ref 4.20–5.80)
RDW: 13.5 % (ref 11.0–15.0)
WBC: 6.8 10*3/uL (ref 3.8–10.8)

## 2016-07-19 LAB — COMPLETE METABOLIC PANEL WITH GFR
ALBUMIN: 4.2 g/dL (ref 3.6–5.1)
ALK PHOS: 68 U/L (ref 40–115)
ALT: 26 U/L (ref 9–46)
AST: 20 U/L (ref 10–40)
BUN: 19 mg/dL (ref 7–25)
CALCIUM: 9.1 mg/dL (ref 8.6–10.3)
CHLORIDE: 105 mmol/L (ref 98–110)
CO2: 28 mmol/L (ref 20–31)
CREATININE: 1.13 mg/dL (ref 0.60–1.35)
GFR, Est Non African American: 80 mL/min (ref >=60)
Glucose, Bld: 59 mg/dL — ABNORMAL LOW (ref 65–99)
POTASSIUM: 4.6 mmol/L (ref 3.5–5.3)
Sodium: 142 mmol/L (ref 135–146)
Total Bilirubin: 0.4 mg/dL (ref 0.2–1.2)
Total Protein: 6.3 g/dL (ref 6.1–8.1)

## 2016-07-19 LAB — LIPID PANEL W/REFLEX DIRECT LDL
CHOLESTEROL: 112 mg/dL (ref <200)
HDL: 31 mg/dL — ABNORMAL LOW (ref >40)
LDL-Cholesterol: 57 mg/dL
NON-HDL CHOLESTEROL (CALC): 81 mg/dL (ref <130)
TRIGLYCERIDES: 165 mg/dL — AB (ref <150)
Total CHOL/HDL Ratio: 3.6 Ratio (ref <5.0)

## 2016-07-19 LAB — PSA: PSA: 0.7 ng/mL (ref <=4.0)

## 2016-07-19 LAB — TESTOSTERONE: Testosterone: 386 ng/dL (ref 250–827)

## 2016-08-08 DIAGNOSIS — G4733 Obstructive sleep apnea (adult) (pediatric): Secondary | ICD-10-CM | POA: Diagnosis not present

## 2016-08-11 DIAGNOSIS — M6281 Muscle weakness (generalized): Secondary | ICD-10-CM | POA: Diagnosis not present

## 2016-08-11 DIAGNOSIS — M25572 Pain in left ankle and joints of left foot: Secondary | ICD-10-CM | POA: Diagnosis not present

## 2016-08-11 DIAGNOSIS — M25562 Pain in left knee: Secondary | ICD-10-CM | POA: Diagnosis not present

## 2016-08-21 ENCOUNTER — Other Ambulatory Visit: Payer: Self-pay | Admitting: *Deleted

## 2016-08-21 DIAGNOSIS — E291 Testicular hypofunction: Secondary | ICD-10-CM

## 2016-08-21 DIAGNOSIS — M6281 Muscle weakness (generalized): Secondary | ICD-10-CM | POA: Diagnosis not present

## 2016-08-21 DIAGNOSIS — M25572 Pain in left ankle and joints of left foot: Secondary | ICD-10-CM | POA: Diagnosis not present

## 2016-08-21 MED ORDER — "NEEDLE (DISP) 18G X 1-1/2"" MISC": 99 refills | Status: DC

## 2016-08-25 DIAGNOSIS — M6281 Muscle weakness (generalized): Secondary | ICD-10-CM | POA: Diagnosis not present

## 2016-08-25 DIAGNOSIS — M25562 Pain in left knee: Secondary | ICD-10-CM | POA: Diagnosis not present

## 2016-08-25 DIAGNOSIS — M25572 Pain in left ankle and joints of left foot: Secondary | ICD-10-CM | POA: Diagnosis not present

## 2016-09-04 DIAGNOSIS — M25562 Pain in left knee: Secondary | ICD-10-CM | POA: Diagnosis not present

## 2016-09-04 DIAGNOSIS — M6281 Muscle weakness (generalized): Secondary | ICD-10-CM | POA: Diagnosis not present

## 2016-09-04 DIAGNOSIS — M25572 Pain in left ankle and joints of left foot: Secondary | ICD-10-CM | POA: Diagnosis not present

## 2016-09-08 DIAGNOSIS — M25572 Pain in left ankle and joints of left foot: Secondary | ICD-10-CM | POA: Diagnosis not present

## 2016-09-08 DIAGNOSIS — M6281 Muscle weakness (generalized): Secondary | ICD-10-CM | POA: Diagnosis not present

## 2016-09-15 DIAGNOSIS — M25562 Pain in left knee: Secondary | ICD-10-CM | POA: Diagnosis not present

## 2016-09-15 DIAGNOSIS — M25572 Pain in left ankle and joints of left foot: Secondary | ICD-10-CM | POA: Diagnosis not present

## 2016-09-15 DIAGNOSIS — M6281 Muscle weakness (generalized): Secondary | ICD-10-CM | POA: Diagnosis not present

## 2016-09-22 DIAGNOSIS — M25572 Pain in left ankle and joints of left foot: Secondary | ICD-10-CM | POA: Diagnosis not present

## 2016-09-22 DIAGNOSIS — M6281 Muscle weakness (generalized): Secondary | ICD-10-CM | POA: Diagnosis not present

## 2016-09-22 DIAGNOSIS — M25562 Pain in left knee: Secondary | ICD-10-CM | POA: Diagnosis not present

## 2016-11-08 DIAGNOSIS — G4733 Obstructive sleep apnea (adult) (pediatric): Secondary | ICD-10-CM | POA: Diagnosis not present

## 2017-01-01 ENCOUNTER — Other Ambulatory Visit: Payer: Self-pay | Admitting: Family Medicine

## 2017-01-01 ENCOUNTER — Encounter: Payer: Self-pay | Admitting: Family Medicine

## 2017-01-01 DIAGNOSIS — Z114 Encounter for screening for human immunodeficiency virus [HIV]: Secondary | ICD-10-CM

## 2017-01-01 DIAGNOSIS — R7989 Other specified abnormal findings of blood chemistry: Secondary | ICD-10-CM

## 2017-01-01 DIAGNOSIS — E291 Testicular hypofunction: Secondary | ICD-10-CM

## 2017-01-03 DIAGNOSIS — R7989 Other specified abnormal findings of blood chemistry: Secondary | ICD-10-CM | POA: Diagnosis not present

## 2017-01-03 DIAGNOSIS — Z114 Encounter for screening for human immunodeficiency virus [HIV]: Secondary | ICD-10-CM | POA: Diagnosis not present

## 2017-01-04 ENCOUNTER — Ambulatory Visit: Payer: BLUE CROSS/BLUE SHIELD | Admitting: Family Medicine

## 2017-01-04 ENCOUNTER — Encounter: Payer: Self-pay | Admitting: Family Medicine

## 2017-01-04 VITALS — BP 136/82 | HR 54 | Temp 97.8°F | Ht 76.0 in | Wt 265.1 lb

## 2017-01-04 DIAGNOSIS — I10 Essential (primary) hypertension: Secondary | ICD-10-CM | POA: Insufficient documentation

## 2017-01-04 DIAGNOSIS — M79672 Pain in left foot: Secondary | ICD-10-CM

## 2017-01-04 DIAGNOSIS — F3342 Major depressive disorder, recurrent, in full remission: Secondary | ICD-10-CM | POA: Diagnosis not present

## 2017-01-04 DIAGNOSIS — R030 Elevated blood-pressure reading, without diagnosis of hypertension: Secondary | ICD-10-CM | POA: Diagnosis not present

## 2017-01-04 DIAGNOSIS — E291 Testicular hypofunction: Secondary | ICD-10-CM | POA: Diagnosis not present

## 2017-01-04 LAB — COMPLETE METABOLIC PANEL WITH GFR
AG RATIO: 2 (calc) (ref 1.0–2.5)
ALKALINE PHOSPHATASE (APISO): 72 U/L (ref 40–115)
ALT: 35 U/L (ref 9–46)
AST: 25 U/L (ref 10–40)
Albumin: 4.5 g/dL (ref 3.6–5.1)
BUN: 19 mg/dL (ref 7–25)
CO2: 29 mmol/L (ref 20–32)
Calcium: 9 mg/dL (ref 8.6–10.3)
Chloride: 104 mmol/L (ref 98–110)
Creat: 0.95 mg/dL (ref 0.60–1.35)
GFR, Est African American: 114 mL/min/{1.73_m2} (ref >=60)
GFR, Est Non African American: 98 mL/min/{1.73_m2} (ref >=60)
GLOBULIN: 2.2 g/dL (ref 1.9–3.7)
Glucose, Bld: 98 mg/dL (ref 65–99)
POTASSIUM: 4.9 mmol/L (ref 3.5–5.3)
SODIUM: 139 mmol/L (ref 135–146)
Total Bilirubin: 0.4 mg/dL (ref 0.2–1.2)
Total Protein: 6.7 g/dL (ref 6.1–8.1)

## 2017-01-04 LAB — HIV ANTIBODY (ROUTINE TESTING W REFLEX): HIV 1&2 Ab, 4th Generation: NONREACTIVE

## 2017-01-04 LAB — CBC
HCT: 44.7 % (ref 38.5–50.0)
Hemoglobin: 15.2 g/dL (ref 13.2–17.1)
MCH: 30.1 pg (ref 27.0–33.0)
MCHC: 34 g/dL (ref 32.0–36.0)
MCV: 88.5 fL (ref 80.0–100.0)
MPV: 11.2 fL (ref 7.5–12.5)
Platelets: 205 10*3/uL (ref 140–400)
RBC: 5.05 10*6/uL (ref 4.20–5.80)
RDW: 12.2 % (ref 11.0–15.0)
WBC: 6.9 10*3/uL (ref 3.8–10.8)

## 2017-01-04 LAB — TESTOSTERONE: TESTOSTERONE: 458 ng/dL (ref 250–827)

## 2017-01-04 LAB — PSA: PSA: 0.7 ng/mL (ref <=4.0)

## 2017-01-04 MED ORDER — VENLAFAXINE HCL ER 75 MG PO CP24: 75.0000 mg | ORAL_CAPSULE | Freq: Every day | ORAL | 1 refills | Status: DC

## 2017-01-04 NOTE — Patient Instructions (Signed)
Thank you for coming in today. Labs look good.  Continue current treatment.  Return for orthotics in the near future.

## 2017-01-04 NOTE — Progress Notes (Signed)
Russell Ortega is a 41 y.o. male who presents to [name]: [facility] today for follow-up low testosterone, elevated blood pressure and depression.  Low testosterone: Russell Ortega uses intramuscular testosterone every 2 weeks.  He notes this works well for him and he tolerates regimen well.  He notes improved libido and energy.  Elevated blood pressure: Russell Ortega has elevated blood pressure.  He is managing it currently with diet and exercise.  He feels well with no chest pain palpitations or shortness of breath.  Depression: Russell Ortega has depression pretty well controlled with Effexor.  He tolerates medication well.  He notes he tends to have worsening mood during the winter but would not like to increase the medication if possible.  Running: Russell Ortega ran his first half marathon recently.  He had some foot pain and blisters and wonders about orthotics.  He is currently feeling pretty well but would like to be able to run more races in the future.  He weight lifts as his primary form of exercise.   Past Medical History:  Diagnosis Date  . Hypogonadism in male 06/26/2014   No past surgical history on file. Social History   Tobacco Use  . Smoking status: Never Smoker  . Smokeless tobacco: Never Used  Substance Use Topics  . Alcohol use: Yes    Alcohol/week: 0.0 oz   family history includes Breast cancer in his mother; Hyperlipidemia in his father; Hypertension in his father.  ROS as above:  Medications: Current Outpatient Medications  Medication Sig Dispense Refill  . Needle, Disp, (HYPODERMIC NEEDLE 22GX1-1/2") 22G X 1-1/2" MISC Use to administer testosterone. 100 each 0  . NEEDLE, DISP, 18 G (B-D HYPODERMIC NEEDLE 18GX1.5") 18G X 1-1/2" MISC Use to draw up testosterone. 100 each prn  . Syringe, Disposable, 1 ML MISC Use to administer testosterone. 100 each 0  . testosterone  cypionate (DEPOTESTOSTERONE CYPIONATE) 200 MG/ML injection Inject 1 mL (200 mg total) once a week into the muscle. Needs lab work. 12 mL 0  . venlafaxine XR (EFFEXOR-XR) 75 MG 24 hr capsule Take 1 capsule (75 mg total) by mouth daily with breakfast. 90 capsule 1   No current facility-administered medications for this visit.    Allergies  Allergen Reactions  . Lidocaine Nausea And Vomiting    Can tolerate Tetracaine  . Tramadol Rash    Health Maintenance Health Maintenance  Topic Date Due  . HIV Screening  05/21/1989  . INFLUENZA VACCINE  09/20/2016  . TETANUS/TDAP  03/20/2024     Exam:  BP 136/82   Pulse (!) 54   Temp 97.8 F (36.6 C) (Oral)   Ht 6\' 4"  (1.93 m)   Wt 265 lb 1.9 oz (120.3 kg)   BMI 32.27 kg/m  Gen: Well NAD HEENT: EOMI,  MMM Lungs: Normal work of breathing. CTABL Heart: RRR no MRG Abd: NABS, Soft. Nondistended, Nontender Exts: Brisk capillary refill, warm and well perfused.  Psych: Alert and oriented normal speech thought process and affect.  No SI or HI expressed. Depression screen [facility] 2/9 01/04/2017 11/05/2015  Decreased Interest 0 3  Down, Depressed, Hopeless 0 2  PHQ - 2 Score 0 5  Altered sleeping - 3  Tired, decreased energy - 2  Change in appetite - 2  Feeling bad or failure about yourself  - 3  Trouble concentrating - 3  Moving slowly or fidgety/restless - 1  Suicidal thoughts - 3  PHQ-9 Score - 22  Difficult doing work/chores - Somewhat difficult     Results for orders placed or performed in visit on 01/01/17 (from the past 72 hour(s))  CBC     Status: None   Collection Time: 01/03/17  8:21 AM  Result Value Ref Range   WBC 6.9 3.8 - 10.8 Thousand/uL   RBC 5.05 4.20 - 5.80 Million/uL   Hemoglobin 15.2 13.2 - 17.1 g/dL   HCT 40.944.7 81.138.5 - 91.450.0 %   MCV 88.5 80.0 - 100.0 fL   MCH 30.1 27.0 - 33.0 pg   MCHC 34.0 32.0 - 36.0 g/dL   RDW 78.212.2 95.611.0 - 21.315.0 %   Platelets 205 140 - 400 Thousand/uL   MPV 11.2 7.5 - 12.5 fL  COMPLETE METABOLIC  PANEL WITH GFR     Status: None   Collection Time: 01/03/17  8:21 AM  Result Value Ref Range   Glucose, Bld 98 65 - 99 mg/dL    Comment: .            Fasting reference interval .    BUN 19 7 - 25 mg/dL   Creat 0.860.95 5.780.60 - 4.691.35 mg/dL   GFR, Est Non African American 98 > OR = 60 mL/min/1.9373m2   GFR, Est African American 114 > OR = 60 mL/min/1.4373m2   BUN/Creatinine Ratio NOT APPLICABLE 6 - 22 (calc)   Sodium 139 135 - 146 mmol/L   Potassium 4.9 3.5 - 5.3 mmol/L   Chloride 104 98 - 110 mmol/L   CO2 29 20 - 32 mmol/L   Calcium 9.0 8.6 - 10.3 mg/dL   Total Protein 6.7 6.1 - 8.1 g/dL   Albumin 4.5 3.6 - 5.1 g/dL   Globulin 2.2 1.9 - 3.7 g/dL (calc)   AG Ratio 2.0 1.0 - 2.5 (calc)   Total Bilirubin 0.4 0.2 - 1.2 mg/dL   Alkaline phosphatase (APISO) 72 40 - 115 U/L   AST 25 10 - 40 U/L   ALT 35 9 - 46 U/L  Testosterone     Status: None   Collection Time: 01/03/17  8:21 AM  Result Value Ref Range   Testosterone 458 250 - 827 ng/dL  PSA     Status: None   Collection Time: 01/03/17  8:21 AM  Result Value Ref Range   PSA 0.7 < OR = 4.0 ng/mL    Comment: The total PSA value from this assay system is  standardized against the WHO standard. The test  result will be approximately 20% lower when compared  to the equimolar-standardized total PSA (Beckman  Coulter). Comparison of serial PSA results should be  interpreted with this fact in mind. . This test was performed using the Siemens  chemiluminescent method. Values obtained from  different assay methods cannot be used interchangeably. PSA levels, regardless of value, should not be interpreted as absolute evidence of the presence or absence of disease.   HIV antibody     Status: None   Collection Time: 01/03/17  8:21 AM  Result Value Ref Range   HIV 1&2 Ab, 4th Generation NON-REACTIVE NON-REACTI    Comment: HIV-1 antigen and HIV-1/HIV-2 antibodies were not detected. There is no laboratory evidence of HIV infection. Marland Kitchen. PLEASE  NOTE: This information has been disclosed to you from records whose confidentiality may be protected by state law.  If your state requires such protection, then the state law prohibits you from making any further disclosure of the information without the specific written consent of the person to whom  it pertains, or as otherwise permitted by law. A general authorization for the release of medical or other information is NOT sufficient for this purpose. . For additional information please refer to http://education.questdiagnostics.com/faq/FAQ106 (This link is being provided for informational/ educational purposes only.) . Marland Kitchen. The performance of this assay has not been clinically validated in patients less than 42 years old. .    No results found.    Assessment and Plan: 41 y.o. male with  Low testosterone doing well testosterone at goal.  Plan to continue current regimen and recheck in 6 months or sooner if needed.  Elevated blood pressure: Blood pressure continues to be at the threshold of treatment.  He seems to be managing pretty well with lifestyle changes.  Plan to continue to follow.  Depression: Reasonably well controlled.  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  Will increase Effexor as needed.  Running pain: Plan to get a video of gait on a treadmill and return in the near future for orthotics.  No orders of the defined types were placed in this encounter.  No orders of the defined types were placed in this encounter.    Discussed warning signs or symptoms. Please see discharge instructions. Patient expresses understanding.

## 2017-02-07 DIAGNOSIS — G4733 Obstructive sleep apnea (adult) (pediatric): Secondary | ICD-10-CM | POA: Diagnosis not present

## 2017-05-10 DIAGNOSIS — G4733 Obstructive sleep apnea (adult) (pediatric): Secondary | ICD-10-CM | POA: Diagnosis not present

## 2017-06-29 ENCOUNTER — Encounter: Payer: Self-pay | Admitting: Family Medicine

## 2017-06-29 DIAGNOSIS — E291 Testicular hypofunction: Secondary | ICD-10-CM

## 2017-06-29 DIAGNOSIS — E785 Hyperlipidemia, unspecified: Secondary | ICD-10-CM | POA: Insufficient documentation

## 2017-06-29 DIAGNOSIS — R030 Elevated blood-pressure reading, without diagnosis of hypertension: Secondary | ICD-10-CM

## 2017-06-29 DIAGNOSIS — M949 Disorder of cartilage, unspecified: Secondary | ICD-10-CM

## 2017-06-29 DIAGNOSIS — E782 Mixed hyperlipidemia: Secondary | ICD-10-CM

## 2017-06-29 DIAGNOSIS — M899 Disorder of bone, unspecified: Secondary | ICD-10-CM

## 2017-07-02 DIAGNOSIS — E782 Mixed hyperlipidemia: Secondary | ICD-10-CM | POA: Diagnosis not present

## 2017-07-02 DIAGNOSIS — M899 Disorder of bone, unspecified: Secondary | ICD-10-CM | POA: Diagnosis not present

## 2017-07-02 DIAGNOSIS — E291 Testicular hypofunction: Secondary | ICD-10-CM | POA: Diagnosis not present

## 2017-07-02 DIAGNOSIS — R030 Elevated blood-pressure reading, without diagnosis of hypertension: Secondary | ICD-10-CM | POA: Diagnosis not present

## 2017-07-03 LAB — COMPLETE METABOLIC PANEL WITH GFR
AG RATIO: 2.2 (calc) (ref 1.0–2.5)
ALBUMIN MSPROF: 4.4 g/dL (ref 3.6–5.1)
ALKALINE PHOSPHATASE (APISO): 66 U/L (ref 40–115)
ALT: 26 U/L (ref 9–46)
AST: 21 U/L (ref 10–40)
BUN: 22 mg/dL (ref 7–25)
CO2: 29 mmol/L (ref 20–32)
CREATININE: 0.97 mg/dL (ref 0.60–1.35)
Calcium: 9 mg/dL (ref 8.6–10.3)
Chloride: 103 mmol/L (ref 98–110)
GFR, Est African American: 110 mL/min/{1.73_m2} (ref >=60)
GFR, Est Non African American: 95 mL/min/{1.73_m2} (ref >=60)
GLOBULIN: 2 g/dL (ref 1.9–3.7)
Glucose, Bld: 104 mg/dL — ABNORMAL HIGH (ref 65–99)
POTASSIUM: 4.7 mmol/L (ref 3.5–5.3)
SODIUM: 138 mmol/L (ref 135–146)
Total Bilirubin: 0.4 mg/dL (ref 0.2–1.2)
Total Protein: 6.4 g/dL (ref 6.1–8.1)

## 2017-07-03 LAB — PSA: PSA: 0.6 ng/mL (ref <=4.0)

## 2017-07-03 LAB — CBC
HEMATOCRIT: 43.4 % (ref 38.5–50.0)
HEMOGLOBIN: 14.8 g/dL (ref 13.2–17.1)
MCH: 30.3 pg (ref 27.0–33.0)
MCHC: 34.1 g/dL (ref 32.0–36.0)
MCV: 88.8 fL (ref 80.0–100.0)
MPV: 11.1 fL (ref 7.5–12.5)
Platelets: 182 10*3/uL (ref 140–400)
RBC: 4.89 10*6/uL (ref 4.20–5.80)
RDW: 12.6 % (ref 11.0–15.0)
WBC: 5.6 10*3/uL (ref 3.8–10.8)

## 2017-07-03 LAB — LIPID PANEL W/REFLEX DIRECT LDL
Cholesterol: 137 mg/dL (ref <200)
HDL: 38 mg/dL — AB (ref >40)
LDL CHOLESTEROL (CALC): 78 mg/dL
NON-HDL CHOLESTEROL (CALC): 99 mg/dL (ref <130)
TRIGLYCERIDES: 125 mg/dL (ref <150)
Total CHOL/HDL Ratio: 3.6 (calc) (ref <5.0)

## 2017-07-03 LAB — TESTOSTERONE: Testosterone: 214 ng/dL — ABNORMAL LOW (ref 250–827)

## 2017-07-03 LAB — VITAMIN D 25 HYDROXY (VIT D DEFICIENCY, FRACTURES): Vit D, 25-Hydroxy: 33 ng/mL (ref 30–100)

## 2017-07-04 ENCOUNTER — Encounter: Payer: Self-pay | Admitting: Family Medicine

## 2017-07-04 ENCOUNTER — Ambulatory Visit: Payer: BLUE CROSS/BLUE SHIELD | Admitting: Family Medicine

## 2017-07-04 VITALS — BP 124/82 | HR 50 | Ht 76.0 in | Wt 278.0 lb

## 2017-07-04 DIAGNOSIS — F3342 Major depressive disorder, recurrent, in full remission: Secondary | ICD-10-CM | POA: Diagnosis not present

## 2017-07-04 DIAGNOSIS — E291 Testicular hypofunction: Secondary | ICD-10-CM

## 2017-07-04 DIAGNOSIS — E669 Obesity, unspecified: Secondary | ICD-10-CM | POA: Diagnosis not present

## 2017-07-04 DIAGNOSIS — R030 Elevated blood-pressure reading, without diagnosis of hypertension: Secondary | ICD-10-CM

## 2017-07-04 MED ORDER — VENLAFAXINE HCL ER 75 MG PO CP24: 75.0000 mg | ORAL_CAPSULE | Freq: Every day | ORAL | 1 refills | Status: DC

## 2017-07-04 MED ORDER — KRILL OIL 1000 MG PO CAPS: 1000.0000 mg | ORAL_CAPSULE | Freq: Every day | ORAL | Status: DC

## 2017-07-04 MED ORDER — SYRINGE (DISPOSABLE) 1 ML MISC: 0 refills | Status: AC

## 2017-07-04 MED ORDER — TESTOSTERONE CYPIONATE 200 MG/ML IM SOLN: 200.0000 mg | INTRAMUSCULAR | 0 refills | Status: DC

## 2017-07-04 MED ORDER — "HYPODERMIC NEEDLE 22G X 1-1/2"" MISC": 0 refills | Status: DC

## 2017-07-04 MED ORDER — "NEEDLE (DISP) 18G X 1-1/2"" MISC": 99 refills | Status: DC

## 2017-07-04 NOTE — Patient Instructions (Signed)
Thank you for coming in today. Leaning down will help.  2000-2500 calorie a day diet will help.  Try to weigh or measure the food if the weight does not slowly come off.  Continue medicine.  Recheck in 6 months for well adult visit.

## 2017-07-04 NOTE — Progress Notes (Signed)
Russell Ortega is a 42 y.o. male who presents to [name]: Primary Care Sports Medicine today for follow up low testosterone, weight, blood pressure, and mood.   Russell Ortega takes depo testosterone every 2 weeks. He feels well on the medicine and notes that his libido is improved. His recent labs were right at the end of the 2 week cycle. He is happy with how his testosterone is going.    Obesity: Russell Ortega has always been a large man. He continues to exercise most of the days a week. He uses FitBit to try to maintain an energy deficit of around 500 calories a day. This works out to be 2500-3000 calories a day depending on exercise. He notes that the weight is not coming off like it should based on his math.  He does not typically measure his food.   Blood pressure: Previously elevated.  Patient is using lifestyle modification to control blood pressure.  He is trying to eat a low-sodium diet and exercise regularly.   Past Medical History:  Diagnosis Date  . Hypogonadism in male 06/26/2014   No past surgical history on file. Social History   Tobacco Use  . Smoking status: Never Smoker  . Smokeless tobacco: Never Used  Substance Use Topics  . Alcohol use: Yes    Alcohol/week: 0.0 oz   family history includes Breast cancer in his mother; Hyperlipidemia in his father; Hypertension in his father.  ROS as above:  Medications: Current Outpatient Medications  Medication Sig Dispense Refill  . Needle, Disp, (HYPODERMIC NEEDLE 22GX1-1/2") 22G X 1-1/2" MISC Use to administer testosterone. 100 each 0  . NEEDLE, DISP, 18 G (B-D HYPODERMIC NEEDLE 18GX1.5") 18G X 1-1/2" MISC Use to draw up testosterone. 100 each prn  . Syringe, Disposable, 1 ML MISC Use to administer testosterone. 100 each 0  . testosterone cypionate (DEPOTESTOSTERONE CYPIONATE) 200 MG/ML injection Inject 1 mL (200 mg total) once a week into  the muscle. Needs lab work. 12 mL 0  . venlafaxine XR (EFFEXOR-XR) 75 MG 24 hr capsule Take 1 capsule (75 mg total) daily with breakfast by mouth. 90 capsule 1   No current facility-administered medications for this visit.    Allergies  Allergen Reactions  . Lidocaine Nausea And Vomiting    Can tolerate Tetracaine  . Tramadol Rash    Health Maintenance Health Maintenance  Topic Date Due  . INFLUENZA VACCINE  01/04/2018 (Originally 09/20/2017)  . TETANUS/TDAP  03/20/2024  . HIV Screening  Completed     Exam:  BP 124/82   Pulse (!) 50   Ht  (1.93 m)   Wt 278 lb (126.1 kg)   BMI 33.84 kg/m   Wt Readings from Last 5 Encounters:  07/04/17 278 lb (126.1 kg)  01/04/17 265 lb 1.9 oz (120.3 kg)  07/06/16 268 lb (121.6 kg)  12/24/15 276 lb (125.2 kg)  11/26/15 283 lb (128.4 kg)    Gen: Well NAD HEENT: EOMI,  MMM Lungs: Normal work of breathing. CTABL Heart: RRR no MRG Abd: NABS, Soft. Nondistended, Nontender Exts: Brisk capillary refill, warm and well perfused.  Psych alert and oriented normal speech thought process and affect.  Depression screen [facility] 2/9 07/04/2017 01/04/2017 11/05/2015  Decreased Interest 0 0 3  Down, Depressed, Hopeless 0 0 2  PHQ - 2 Score 0 0 5  Altered sleeping 1 - 3  Tired, decreased energy 0 - 2  Change in appetite 0 - 2  Feeling bad or failure about yourself  0 - 3  Trouble concentrating 0 - 3  Moving slowly or fidgety/restless 0 - 1  Suicidal thoughts 0 - 3  PHQ-9 Score 1 - 22  Difficult doing work/chores Not difficult at all - Somewhat difficult     Results for orders placed or performed in visit on 06/29/17 (from the past 72 hour(s))  CBC     Status: None   Collection Time: 07/02/17  8:11 AM  Result Value Ref Range   WBC 5.6 3.8 - 10.8 Thousand/uL   RBC 4.89 4.20 - 5.80 Million/uL   Hemoglobin 14.8 13.2 - 17.1 g/dL   HCT 40.9 81.1 - 91.4 %   MCV 88.8 80.0 - 100.0 fL   MCH 30.3 27.0 - 33.0 pg   MCHC 34.1 32.0 - 36.0 g/dL   RDW  78.2 95.6 - 21.3 %   Platelets 182 140 - 400 Thousand/uL   MPV 11.1 7.5 - 12.5 fL  COMPLETE METABOLIC PANEL WITH GFR     Status: Abnormal   Collection Time: 07/02/17  8:11 AM  Result Value Ref Range   Glucose, Bld 104 (H) 65 - 99 mg/dL    Comment: .            Fasting reference interval . For someone without known diabetes, a glucose value between 100 and 125 mg/dL is consistent with prediabetes and should be confirmed with a follow-up test. .    BUN 22 7 - 25 mg/dL   Creat 0.86 5.78 - 4.69 mg/dL   GFR, Est Non African American 95 > OR = 60 mL/min/1.61m2   GFR, Est African American 110 > OR = 60 mL/min/1.44m2   BUN/Creatinine Ratio NOT APPLICABLE 6 - 22 (calc)   Sodium 138 135 - 146 mmol/L   Potassium 4.7 3.5 - 5.3 mmol/L   Chloride 103 98 - 110 mmol/L   CO2 29 20 - 32 mmol/L   Calcium 9.0 8.6 - 10.3 mg/dL   Total Protein 6.4 6.1 - 8.1 g/dL   Albumin 4.4 3.6 - 5.1 g/dL   Globulin 2.0 1.9 - 3.7 g/dL (calc)   AG Ratio 2.2 1.0 - 2.5 (calc)   Total Bilirubin 0.4 0.2 - 1.2 mg/dL   Alkaline phosphatase (APISO) 66 40 - 115 U/L   AST 21 10 - 40 U/L   ALT 26 9 - 46 U/L  PSA     Status: None   Collection Time: 07/02/17  8:11 AM  Result Value Ref Range   PSA 0.6 < OR = 4.0 ng/mL    Comment: The total PSA value from this assay system is  standardized against the WHO standard. The test  result will be approximately 20% lower when compared  to the equimolar-standardized total PSA (Beckman  Coulter). Comparison of serial PSA results should be  interpreted with this fact in mind. . This test was performed using the Siemens  chemiluminescent method. Values obtained from  different assay methods cannot be used interchangeably. PSA levels, regardless of value, should not be interpreted as absolute evidence of the presence or absence of disease.   Testosterone     Status: Abnormal   Collection Time: 07/02/17  8:11 AM  Result Value Ref Range   Testosterone 214 (L) 250 - 827 ng/dL     Comment: In hypogonadal males, Testosterone, Total, LC/MS/MS, is the recommended assay due to the diminished accuracy of immunoassay at levels below 250 ng/dL. This test code (62952) must be collected  in a red-top tube with no gel.    VITAMIN D 25 Hydroxy (Vit-D Deficiency, Fractures)     Status: None   Collection Time: 07/02/17  8:11 AM  Result Value Ref Range   Vit D, 25-Hydroxy 33 30 - 100 ng/mL    Comment: Vitamin D Status         25-OH Vitamin D: . Deficiency:                    <20 ng/mL Insufficiency:             20 - 29 ng/mL Optimal:                 > or = 30 ng/mL . For 25-OH Vitamin D testing on patients on  D2-supplementation and patients for whom quantitation  of D2 and D3 fractions is required, the QuestAssureD(TM) 25-OH VIT D, (D2,D3), LC/MS/MS is recommended: order  code 40981 (patients >73yrs). . For more information on this test, go to: http://education.questdiagnostics.com/faq/FAQ163 (This link is being provided for  informational/educational purposes only.)   Lipid Panel w/reflex Direct LDL     Status: Abnormal   Collection Time: 07/02/17  8:11 AM  Result Value Ref Range   Cholesterol 137 <200 mg/dL   HDL 38 (L) >19 mg/dL   Triglycerides 147 <829 mg/dL   LDL Cholesterol (Calc) 78 mg/dL (calc)    Comment: Reference range: <100 . Desirable range <100 mg/dL for primary prevention;   <70 mg/dL for patients with CHD or diabetic patients  with > or = 2 CHD risk factors. Marland Kitchen LDL-C is now calculated using the Martin-Hopkins  calculation, which is a validated novel method providing  better accuracy than the Friedewald equation in the  estimation of LDL-C.  Russell Ortega et al. Lenox Ahr. 5621;308(65): 2061-2068  (http://education.[facility].com/faq/FAQ164)    Total CHOL/HDL Ratio 3.6 <5.0 (calc)   Non-HDL Cholesterol (Calc) 99 <784 mg/dL (calc)    Comment: For patients with diabetes plus 1 major ASCVD risk  factor, treating to a non-HDL-C goal of <100 mg/dL    (LDL-C of <69 mg/dL) is considered a therapeutic  option.    No results found.    Assessment and Plan: 42 y.o. male with  Low testosterone: Doing well.  Plan to continue current regimen and recheck in 6 months.  Obesity: Discussed calorie logging and measuring and set calorie goal for 2000-2500 cal/day.  Continue current exercise.  This should result in 1 to 2 pounds of weight loss per week.  If this does not happen emphasize food measuring.  Recheck in 6 months..  Blood pressure at goal today.  Continue current lifestyle.  Depression: Doing extremely well.  Continue current regimen.   No orders of the defined types were placed in this encounter.  No orders of the defined types were placed in this encounter.    Discussed warning signs or symptoms. Please see discharge instructions. Patient expresses understanding.

## 2017-08-10 DIAGNOSIS — G4733 Obstructive sleep apnea (adult) (pediatric): Secondary | ICD-10-CM | POA: Diagnosis not present

## 2017-10-23 ENCOUNTER — Encounter: Payer: Self-pay | Admitting: Family Medicine

## 2017-10-23 DIAGNOSIS — E291 Testicular hypofunction: Secondary | ICD-10-CM

## 2017-10-23 MED ORDER — TESTOSTERONE CYPIONATE 200 MG/ML IM SOLN: 200.0000 mg | INTRAMUSCULAR | 0 refills | Status: DC

## 2017-12-20 IMAGING — DX DG CERVICAL SPINE COMPLETE 4+V
6 series · 6 of 6 positions shown · non-contrast
Comparison: None.

CLINICAL DATA: Cervical radiculopathy. Pain radiating down right
arm for 1 week.

EXAM:
CERVICAL SPINE - COMPLETE 4+ VIEW

[c-spine lat]
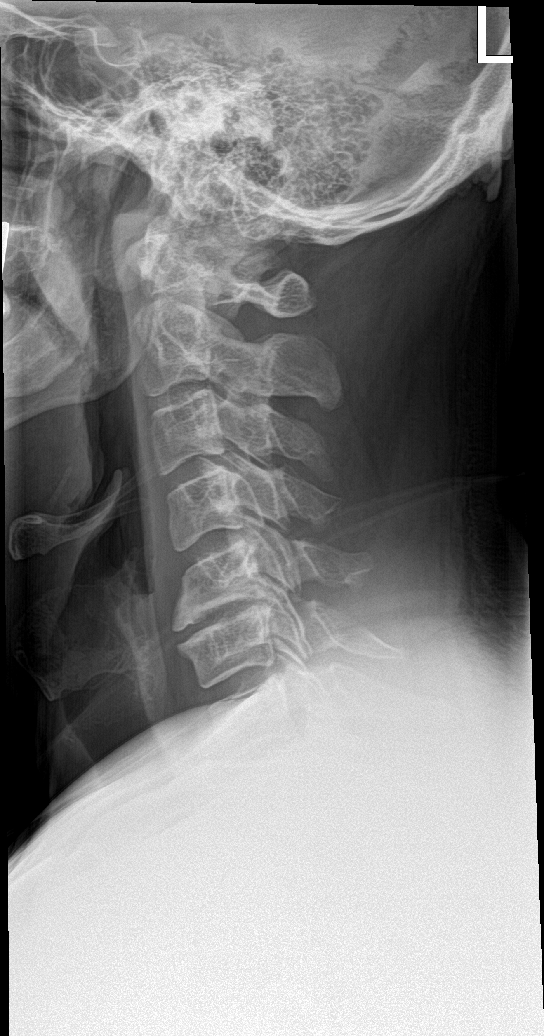

[c-spine obl (1 of 2)]
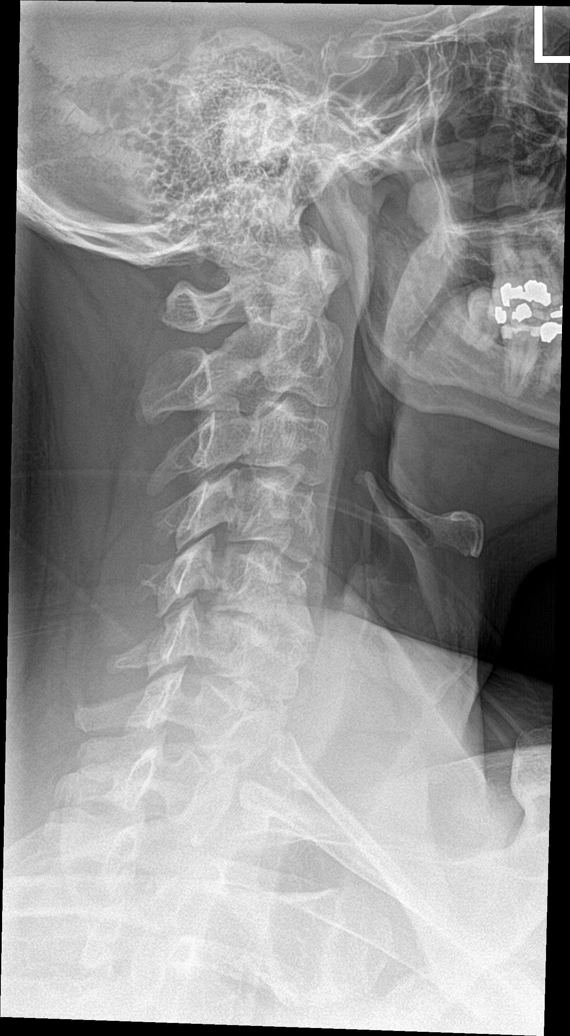

[c-spine obl (2 of 2)]
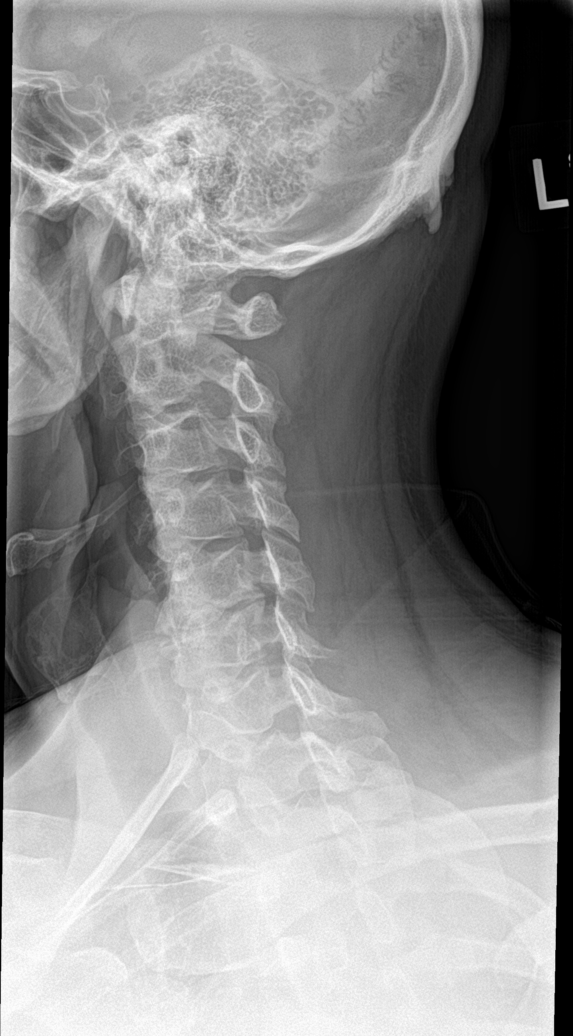

[c-spine ap]
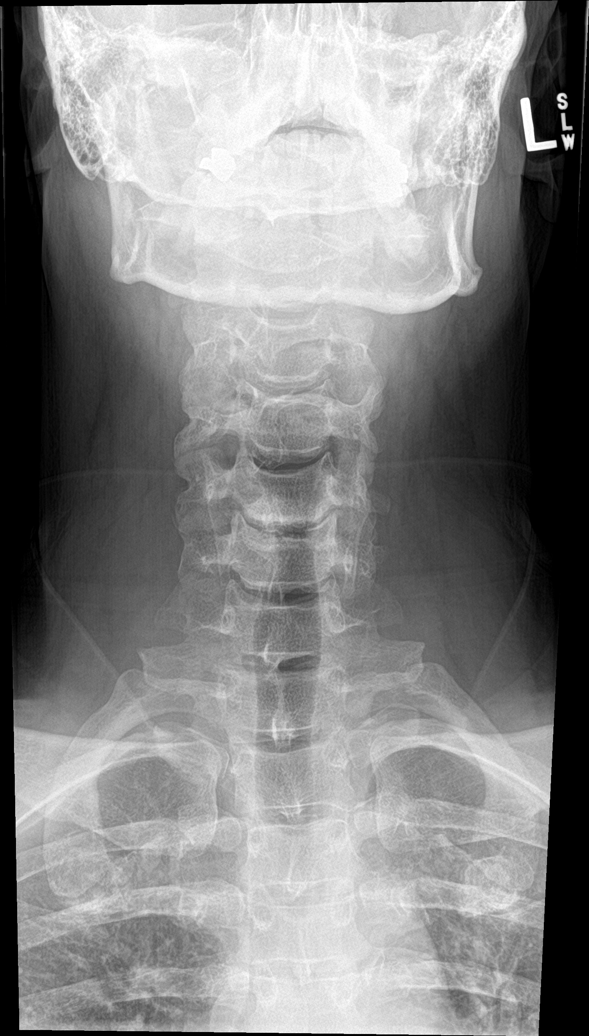

[c-spine open mouth]
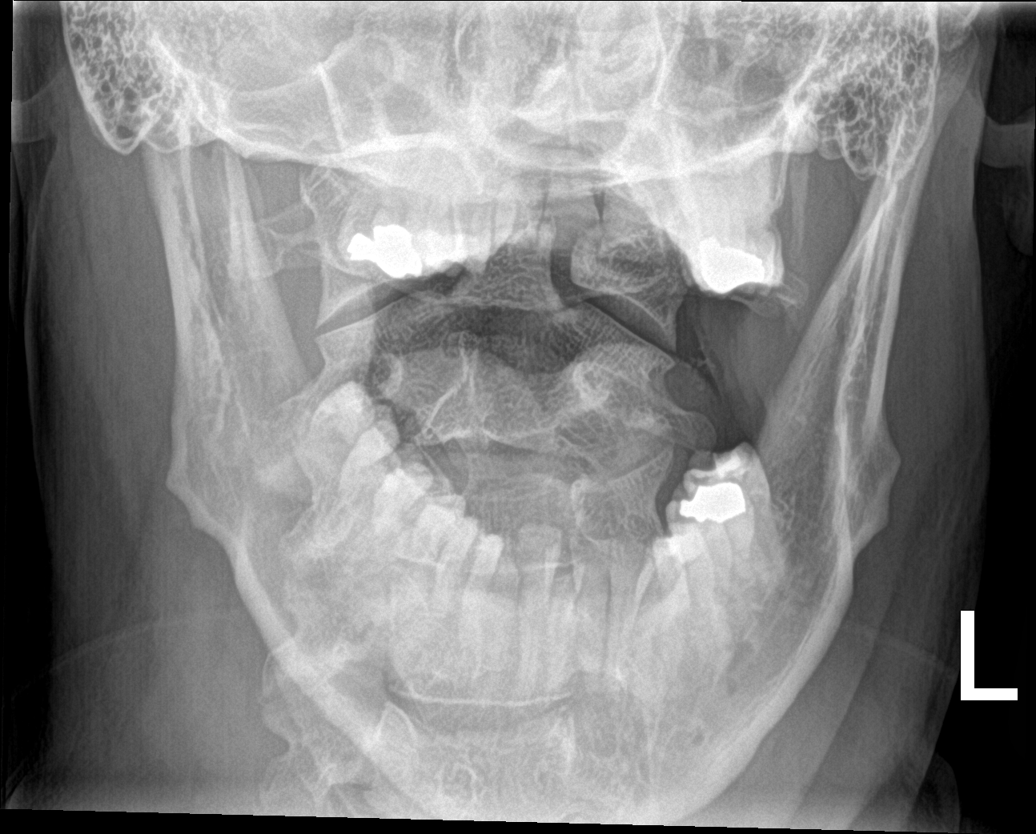

[c-spine swimmers]
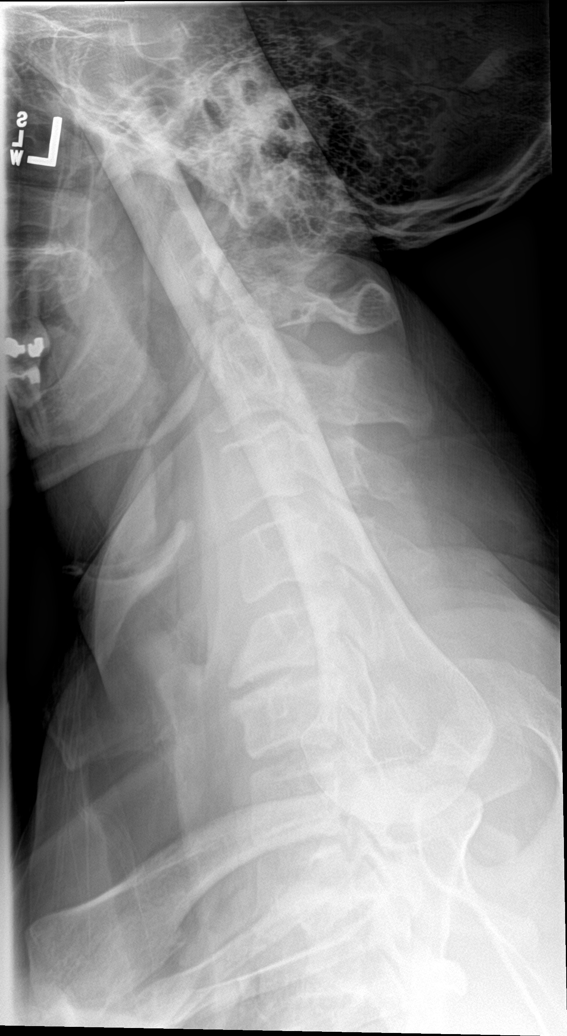

[6 of 6 positions shown; findings below may reference images not displayed]

FINDINGS: Degenerative disc disease at C5-6 with disc space narrowing and
spurring. Prevertebral soft tissues are normal. Alignment is normal.
No fracture. Mild bilateral neural foraminal narrowing at C5-6 due
to uncovertebral spurring.
IMPRESSION: Degenerative changes as above.  No acute findings.

## 2017-12-31 ENCOUNTER — Encounter: Payer: Self-pay | Admitting: Family Medicine

## 2017-12-31 DIAGNOSIS — E291 Testicular hypofunction: Secondary | ICD-10-CM

## 2017-12-31 DIAGNOSIS — E669 Obesity, unspecified: Secondary | ICD-10-CM

## 2017-12-31 DIAGNOSIS — R030 Elevated blood-pressure reading, without diagnosis of hypertension: Secondary | ICD-10-CM

## 2017-12-31 DIAGNOSIS — E782 Mixed hyperlipidemia: Secondary | ICD-10-CM

## 2018-01-02 DIAGNOSIS — R030 Elevated blood-pressure reading, without diagnosis of hypertension: Secondary | ICD-10-CM | POA: Diagnosis not present

## 2018-01-02 DIAGNOSIS — E782 Mixed hyperlipidemia: Secondary | ICD-10-CM | POA: Diagnosis not present

## 2018-01-02 DIAGNOSIS — E291 Testicular hypofunction: Secondary | ICD-10-CM | POA: Diagnosis not present

## 2018-01-03 LAB — COMPLETE METABOLIC PANEL WITH GFR
AG Ratio: 1.9 (calc) (ref 1.0–2.5)
ALT: 31 U/L (ref 9–46)
AST: 18 U/L (ref 10–40)
Albumin: 4.6 g/dL (ref 3.6–5.1)
Alkaline phosphatase (APISO): 75 U/L (ref 40–115)
BUN: 13 mg/dL (ref 7–25)
CALCIUM: 9.6 mg/dL (ref 8.6–10.3)
CHLORIDE: 102 mmol/L (ref 98–110)
CO2: 31 mmol/L (ref 20–32)
CREATININE: 1.02 mg/dL (ref 0.60–1.35)
GFR, EST AFRICAN AMERICAN: 104 mL/min/{1.73_m2} (ref >=60)
GFR, EST NON AFRICAN AMERICAN: 90 mL/min/{1.73_m2} (ref >=60)
GLUCOSE: 104 mg/dL — AB (ref 65–99)
Globulin: 2.4 g/dL (calc) (ref 1.9–3.7)
Potassium: 4.8 mmol/L (ref 3.5–5.3)
Sodium: 139 mmol/L (ref 135–146)
TOTAL PROTEIN: 7 g/dL (ref 6.1–8.1)
Total Bilirubin: 0.5 mg/dL (ref 0.2–1.2)

## 2018-01-03 LAB — CBC
HEMATOCRIT: 44.9 % (ref 38.5–50.0)
HEMOGLOBIN: 15.5 g/dL (ref 13.2–17.1)
MCH: 30.5 pg (ref 27.0–33.0)
MCHC: 34.5 g/dL (ref 32.0–36.0)
MCV: 88.4 fL (ref 80.0–100.0)
MPV: 11.3 fL (ref 7.5–12.5)
Platelets: 217 10*3/uL (ref 140–400)
RBC: 5.08 10*6/uL (ref 4.20–5.80)
RDW: 12.7 % (ref 11.0–15.0)
WBC: 7.5 10*3/uL (ref 3.8–10.8)

## 2018-01-03 LAB — TESTOSTERONE: Testosterone: 319 ng/dL (ref 250–827)

## 2018-01-03 LAB — PSA: PSA: 0.7 ng/mL (ref <=4.0)

## 2018-01-04 ENCOUNTER — Ambulatory Visit (INDEPENDENT_AMBULATORY_CARE_PROVIDER_SITE_OTHER): Payer: BLUE CROSS/BLUE SHIELD | Admitting: Family Medicine

## 2018-01-04 VITALS — BP 149/82 | HR 61 | Ht 76.0 in | Wt 286.0 lb

## 2018-01-04 DIAGNOSIS — R030 Elevated blood-pressure reading, without diagnosis of hypertension: Secondary | ICD-10-CM | POA: Diagnosis not present

## 2018-01-04 DIAGNOSIS — E669 Obesity, unspecified: Secondary | ICD-10-CM

## 2018-01-04 DIAGNOSIS — F330 Major depressive disorder, recurrent, mild: Secondary | ICD-10-CM

## 2018-01-04 DIAGNOSIS — E291 Testicular hypofunction: Secondary | ICD-10-CM

## 2018-01-04 DIAGNOSIS — Z0000 Encounter for general adult medical examination without abnormal findings: Secondary | ICD-10-CM

## 2018-01-04 MED ORDER — TESTOSTERONE CYPIONATE 200 MG/ML IM SOLN: 200.0000 mg | INTRAMUSCULAR | 0 refills | Status: DC

## 2018-01-04 MED ORDER — VENLAFAXINE HCL ER 150 MG PO CP24: 150.0000 mg | ORAL_CAPSULE | Freq: Every day | ORAL | 1 refills | Status: DC

## 2018-01-04 NOTE — Patient Instructions (Addendum)
Thank you for coming in today.  See if you can get back into self care exercise.   Increase effexor to 150mg  daily.   Increase testosterone frequency to weekly form every 2 weeks.   Recheck in 3 months mood and labs.  Send me a mood update in 1 month via mychart or return sooner.    Major Depressive Disorder, Adult Major depressive disorder (MDD) is a mental health condition. MDD often makes you feel sad, hopeless, or helpless. MDD can also cause symptoms in your body. MDD can affect your:  Work.  School.  Relationships.  Other normal activities.  MDD can range from mild to very bad. It may occur once (single episode MDD). It can also occur many times (recurrent MDD). The main symptoms of MDD often include:  Feeling sad, depressed, or irritable most of the time.  Loss of interest.  MDD symptoms also include:  Sleeping too much or too little.  Eating too much or too little.  A change in your weight.  Feeling tired (fatigue) or having low energy.  Feeling worthless.  Feeling guilty.  Trouble making decisions.  Trouble thinking clearly.  Thoughts of suicide or harming others.  Feeling weak.  Feeling agitated.  Keeping yourself from being around other people (isolation).  Follow these instructions at home: Activity  Do these things as told by your doctor: ? Go back to your normal activities. ? Exercise regularly. ? Spend time outdoors. Alcohol  Talk with your doctor about how alcohol can affect your antidepressant medicines.  Do not drink alcohol. Or, limit how much alcohol you drink. ? This means no more than 1 drink a day for nonpregnant women and 2 drinks a day for men. One drink equals one of these:  12 oz of beer.  5 oz of wine.  1 oz of hard liquor. General instructions  Take over-the-counter and prescription medicines only as told by your doctor.  Eat a healthy diet.  Get plenty of sleep.  Find activities that you enjoy. Make time  to do them.  Think about joining a support group. Your doctor may be able to suggest a group for you.  Keep all follow-up visits as told by your doctor. This is important. Where to find more information:  The [employer] Mental Illness: ? www.nami.org  [employer] Mental Health: ? http://www.maynard.net/www.nimh.nih.gov  National Suicide Prevention Lifeline: ? 847-249-81831-3324357679. This is free, 24-hour help. Contact a doctor if:  Your symptoms get worse.  You have new symptoms. Get help right away if:  You self-harm.  You see, hear, taste, smell, or feel things that are not present (hallucinate). If you ever feel like you may hurt yourself or others, or have thoughts about taking your own life, get help right away. You can go to your nearest emergency department or call:  Your local emergency services (911 in the [place]  A suicide crisis helpline, such as the National Suicide Prevention Lifeline: ? 819-198-43341-3324357679. This is open 24 hours a day.  This information is not intended to replace advice given to you by your health care provider. Make sure you discuss any questions you have with your health care provider. Document Released: 01/18/2015 Document Revised: 10/24/2015 Document Reviewed: 10/24/2015 Elsevier Interactive Patient Education  2017 [employer]

## 2018-01-04 NOTE — Progress Notes (Signed)
Russell Ortega is a 42 y.o. male who presents to [facility]: Primary Care Sports Medicine today for well adult visit.  Russell Ortega is doing reasonably well overall.  He notes increased life stress recently due to his job.  He has a overbearing demanding boss and notes that it is becoming more challenging at work.  He notes he is also stopped exercising as much and feels less motivated to exercise.  He is not sure if this is because of job stress or because of his testosterone.  He continues to use 200 mg of testosterone every 2 weeks.  He notes that it tends to wear off pretty quickly after a week or so after the last shot and he feels more fatigue and less energy and less libido towards the second week.  He has had weight gain and notes that his blood pressure is up a bit as well.  No chest pain palpitations shortness of breath.  He continues to take Effexor 75 mg extended release daily for mood.   ROS as above:  Past Medical History:  Diagnosis Date  . Hypogonadism in male 06/26/2014   No past surgical history on file. Social History   Tobacco Use  . Smoking status: Never Smoker  . Smokeless tobacco: Never Used  Substance Use Topics  . Alcohol use: Yes    Alcohol/week: 0.0 standard drinks   family history includes Breast cancer in his mother; Hyperlipidemia in his father; Hypertension in his father.  Medications: Current Outpatient Medications  Medication Sig Dispense Refill  . Krill Oil 1000 MG CAPS Take 1 capsule (1,000 mg total) by mouth daily.    . Multiple Vitamin (MULTIVITAMIN) capsule Take 1 capsule by mouth daily.    . Needle, Disp, (HYPODERMIC NEEDLE 22GX1-1/2") 22G X 1-1/2" MISC Use to administer testosterone. 100 each 0  . NEEDLE, DISP, 18 G (B-D HYPODERMIC NEEDLE 18GX1.5") 18G X 1-1/2" MISC Use to draw up testosterone. 100 each prn  . Syringe, Disposable, 1 ML MISC Use to administer  testosterone. 100 each 0  . testosterone cypionate (DEPOTESTOSTERONE CYPIONATE) 200 MG/ML injection Inject 1 mL (200 mg total) into the muscle once a week. 12 mL 0  . venlafaxine XR (EFFEXOR-XR) 150 MG 24 hr capsule Take 1 capsule (150 mg total) by mouth daily with breakfast. 90 capsule 1   No current facility-administered medications for this visit.    Allergies  Allergen Reactions  . Lidocaine Nausea And Vomiting    Can tolerate Tetracaine  . Tramadol Rash    Health Maintenance Health Maintenance  Topic Date Due  . INFLUENZA VACCINE  01/04/2018 (Originally 09/20/2017)  . TETANUS/TDAP  03/20/2024  . HIV Screening  Completed     Exam:  BP (!) 149/82   Pulse 61   Ht 6\' 4"  (1.93 m)   Wt 286 lb (129.7 kg)   BMI 34.81 kg/m  Wt Readings from Last 5 Encounters:  01/04/18 286 lb (129.7 kg)  07/04/17 278 lb (126.1 kg)  01/04/17 265 lb 1.9 oz (120.3 kg)  07/06/16 268 lb (121.6 kg)  12/24/15 276 lb (125.2 kg)      Gen: Well NAD HEENT: EOMI,  MMM Lungs: Normal work of breathing. CTABL Heart: RRR no MRG Abd: NABS, Soft. Nondistended, Nontender Exts: Brisk capillary refill, warm and well perfused.  Psych: Alert and oriented normal speech thought process.  Affect slightly flat.  No SI or HI expressed.  Depression screen [facility] 2/9 01/04/2018 07/04/2017  01/04/2017 11/05/2015  Decreased Interest 2 0 0 3  Down, Depressed, Hopeless 1 0 0 2  PHQ - 2 Score 3 0 0 5  Altered sleeping 1 1 - 3  Tired, decreased energy 1 0 - 2  Change in appetite 2 0 - 2  Feeling bad or failure about yourself  1 0 - 3  Trouble concentrating 0 0 - 3  Moving slowly or fidgety/restless 0 0 - 1  Suicidal thoughts 0 0 - 3  PHQ-9 Score 8 1 - 22  Difficult doing work/chores Somewhat difficult Not difficult at all - Somewhat difficult    GAD 7 : Generalized Anxiety Score 01/04/2018 07/04/2017 11/05/2015  Nervous, Anxious, on Edge 1 0 3  Control/stop worrying 1 0 3  Worry too much - different things 1 0 3    Trouble relaxing 1 0 3  Restless 1 0 2  Easily annoyed or irritable 2 0 3  Afraid - awful might happen 1 0 3  Total GAD 7 Score 8 0 20  Anxiety Difficulty Somewhat difficult - Somewhat difficult       Lab and Radiology Results Results for orders placed or performed in visit on 12/31/17 (from the past 72 hour(s))  Testosterone     Status: None   Collection Time: 01/02/18  8:16 AM  Result Value Ref Range   Testosterone 319 250 - 827 ng/dL  CBC     Status: None   Collection Time: 01/02/18  8:16 AM  Result Value Ref Range   WBC 7.5 3.8 - 10.8 Thousand/uL   RBC 5.08 4.20 - 5.80 Million/uL   Hemoglobin 15.5 13.2 - 17.1 g/dL   HCT 29.544.9 62.138.5 - 30.850.0 %   MCV 88.4 80.0 - 100.0 fL   MCH 30.5 27.0 - 33.0 pg   MCHC 34.5 32.0 - 36.0 g/dL   RDW 65.712.7 84.611.0 - 96.215.0 %   Platelets 217 140 - 400 Thousand/uL   MPV 11.3 7.5 - 12.5 fL  PSA     Status: None   Collection Time: 01/02/18  8:16 AM  Result Value Ref Range   PSA 0.7 < OR = 4.0 ng/mL    Comment: The total PSA value from this assay system is  standardized against the WHO standard. The test  result will be approximately 20% lower when compared  to the equimolar-standardized total PSA (Beckman  Coulter). Comparison of serial PSA results should be  interpreted with this fact in mind. . This test was performed using the Siemens  chemiluminescent method. Values obtained from  different assay methods cannot be used interchangeably. PSA levels, regardless of value, should not be interpreted as absolute evidence of the presence or absence of disease.   COMPLETE METABOLIC PANEL WITH GFR     Status: Abnormal   Collection Time: 01/02/18  8:16 AM  Result Value Ref Range   Glucose, Bld 104 (H) 65 - 99 mg/dL    Comment: .            Fasting reference interval . For someone without known diabetes, a glucose value between 100 and 125 mg/dL is consistent with prediabetes and should be confirmed with a follow-up test. .    BUN 13 7 - 25  mg/dL   Creat 9.521.02 8.410.60 - 3.241.35 mg/dL   GFR, Est Non African American 90 > OR = 60 mL/min/1.5273m2   GFR, Est African American 104 > OR = 60 mL/min/1.4173m2   BUN/Creatinine Ratio NOT APPLICABLE 6 - 22 (calc)  Sodium 139 135 - 146 mmol/L   Potassium 4.8 3.5 - 5.3 mmol/L   Chloride 102 98 - 110 mmol/L   CO2 31 20 - 32 mmol/L   Calcium 9.6 8.6 - 10.3 mg/dL   Total Protein 7.0 6.1 - 8.1 g/dL   Albumin 4.6 3.6 - 5.1 g/dL   Globulin 2.4 1.9 - 3.7 g/dL (calc)   AG Ratio 1.9 1.0 - 2.5 (calc)   Total Bilirubin 0.5 0.2 - 1.2 mg/dL   Alkaline phosphatase (APISO) 75 40 - 115 U/L   AST 18 10 - 40 U/L   ALT 31 9 - 46 U/L   No results found.    Assessment and Plan: 42 y.o. male with well adult.  Doing reasonably well.  Weight gain and hypertension worsening recently.  Additionally patient has worsening mood.  Lastly I do not think his testosterone dose is lasting.  Work on increasing exercise and self-care lifestyle changes.  Increase Effexor to 150 mg daily for mood.  Increase testosterone frequency to once weekly.  Recheck via my chart mood in 1 month.  Recheck in person 2 weeks recheck blood pressure then.   No orders of the defined types were placed in this encounter.  Meds ordered this encounter  Medications  . testosterone cypionate (DEPOTESTOSTERONE CYPIONATE) 200 MG/ML injection    Sig: Inject 1 mL (200 mg total) into the muscle once a week.    Dispense:  12 mL    Refill:  0  . venlafaxine XR (EFFEXOR-XR) 150 MG 24 hr capsule    Sig: Take 1 capsule (150 mg total) by mouth daily with breakfast.    Dispense:  90 capsule    Refill:  1     Discussed warning signs or symptoms. Please see discharge instructions. Patient expresses understanding.

## 2018-02-11 DIAGNOSIS — G4733 Obstructive sleep apnea (adult) (pediatric): Secondary | ICD-10-CM | POA: Diagnosis not present

## 2018-04-04 ENCOUNTER — Encounter: Payer: Self-pay | Admitting: Family Medicine

## 2018-04-04 DIAGNOSIS — F330 Major depressive disorder, recurrent, mild: Secondary | ICD-10-CM

## 2018-04-04 DIAGNOSIS — E669 Obesity, unspecified: Secondary | ICD-10-CM

## 2018-04-04 DIAGNOSIS — E291 Testicular hypofunction: Secondary | ICD-10-CM

## 2018-04-04 DIAGNOSIS — Z5181 Encounter for therapeutic drug level monitoring: Secondary | ICD-10-CM

## 2018-04-04 DIAGNOSIS — E782 Mixed hyperlipidemia: Secondary | ICD-10-CM

## 2018-04-04 DIAGNOSIS — R030 Elevated blood-pressure reading, without diagnosis of hypertension: Secondary | ICD-10-CM

## 2018-04-04 DIAGNOSIS — E66811 Obesity, class 1: Secondary | ICD-10-CM

## 2018-04-04 DIAGNOSIS — R7989 Other specified abnormal findings of blood chemistry: Secondary | ICD-10-CM

## 2018-04-05 DIAGNOSIS — E782 Mixed hyperlipidemia: Secondary | ICD-10-CM | POA: Diagnosis not present

## 2018-04-05 DIAGNOSIS — R030 Elevated blood-pressure reading, without diagnosis of hypertension: Secondary | ICD-10-CM | POA: Diagnosis not present

## 2018-04-05 DIAGNOSIS — R7989 Other specified abnormal findings of blood chemistry: Secondary | ICD-10-CM | POA: Diagnosis not present

## 2018-04-05 DIAGNOSIS — E291 Testicular hypofunction: Secondary | ICD-10-CM | POA: Diagnosis not present

## 2018-04-06 LAB — COMPLETE METABOLIC PANEL WITH GFR
AG RATIO: 2 (calc) (ref 1.0–2.5)
ALBUMIN MSPROF: 4.4 g/dL (ref 3.6–5.1)
ALKALINE PHOSPHATASE (APISO): 58 U/L (ref 36–130)
ALT: 47 U/L — ABNORMAL HIGH (ref 9–46)
AST: 33 U/L (ref 10–40)
BILIRUBIN TOTAL: 0.5 mg/dL (ref 0.2–1.2)
BUN: 16 mg/dL (ref 7–25)
CHLORIDE: 103 mmol/L (ref 98–110)
CO2: 27 mmol/L (ref 20–32)
Calcium: 9.1 mg/dL (ref 8.6–10.3)
Creat: 1.1 mg/dL (ref 0.60–1.35)
GFR, EST AFRICAN AMERICAN: 95 mL/min/{1.73_m2} (ref >=60)
GFR, Est Non African American: 82 mL/min/{1.73_m2} (ref >=60)
GLOBULIN: 2.2 g/dL (ref 1.9–3.7)
GLUCOSE: 99 mg/dL (ref 65–99)
POTASSIUM: 4.7 mmol/L (ref 3.5–5.3)
SODIUM: 138 mmol/L (ref 135–146)
TOTAL PROTEIN: 6.6 g/dL (ref 6.1–8.1)

## 2018-04-06 LAB — LIPID PANEL W/REFLEX DIRECT LDL
CHOL/HDL RATIO: 4.6 (calc) (ref <5.0)
CHOLESTEROL: 173 mg/dL (ref <200)
HDL: 38 mg/dL — ABNORMAL LOW (ref >=40)
LDL CHOLESTEROL (CALC): 106 mg/dL — AB
Non-HDL Cholesterol (Calc): 135 mg/dL (calc) — ABNORMAL HIGH (ref <130)
TRIGLYCERIDES: 170 mg/dL — AB (ref <150)

## 2018-04-06 LAB — CBC
HCT: 46.3 % (ref 38.5–50.0)
HEMOGLOBIN: 16 g/dL (ref 13.2–17.1)
MCH: 30.8 pg (ref 27.0–33.0)
MCHC: 34.6 g/dL (ref 32.0–36.0)
MCV: 89 fL (ref 80.0–100.0)
MPV: 11.2 fL (ref 7.5–12.5)
Platelets: 214 10*3/uL (ref 140–400)
RBC: 5.2 10*6/uL (ref 4.20–5.80)
RDW: 12.7 % (ref 11.0–15.0)
WBC: 7.4 10*3/uL (ref 3.8–10.8)

## 2018-04-06 LAB — PSA: PSA: 0.8 ng/mL (ref <=4.0)

## 2018-04-06 LAB — TESTOSTERONE: Testosterone: 655 ng/dL (ref 250–827)

## 2018-04-08 ENCOUNTER — Encounter: Payer: Self-pay | Admitting: Family Medicine

## 2018-04-08 ENCOUNTER — Ambulatory Visit (INDEPENDENT_AMBULATORY_CARE_PROVIDER_SITE_OTHER): Payer: BLUE CROSS/BLUE SHIELD | Admitting: Family Medicine

## 2018-04-08 VITALS — BP 141/89 | HR 61 | Ht 76.0 in | Wt 301.0 lb

## 2018-04-08 DIAGNOSIS — F325 Major depressive disorder, single episode, in full remission: Secondary | ICD-10-CM

## 2018-04-08 DIAGNOSIS — R7989 Other specified abnormal findings of blood chemistry: Secondary | ICD-10-CM

## 2018-04-08 DIAGNOSIS — M545 Low back pain, unspecified: Secondary | ICD-10-CM

## 2018-04-08 DIAGNOSIS — E291 Testicular hypofunction: Secondary | ICD-10-CM

## 2018-04-08 DIAGNOSIS — I10 Essential (primary) hypertension: Secondary | ICD-10-CM

## 2018-04-08 DIAGNOSIS — E782 Mixed hyperlipidemia: Secondary | ICD-10-CM

## 2018-04-08 MED ORDER — TESTOSTERONE CYPIONATE 200 MG/ML IM SOLN: 200.0000 mg | INTRAMUSCULAR | 1 refills | Status: DC

## 2018-04-08 NOTE — Progress Notes (Signed)
Russell Ortega is a 43 y.o. male who presents to [name] today for follow-up hypertension, testosterone, back pain, and obesity.  Bryker has a history of weight gain over the past several months.  He has had increased stressors at work and has had slow weight gain.  He also notes some back pain this interferes his ability to exercise normally.  He is not eating a careful diet and is not exercising as much as he had been.  He does exercise several times per week still however.  He notes frequent snacking especially around bedtime.  He recognizes his weight is a central medical problem and is eager to work hard to get it better.  Additionally he has a history of hypogonadism.  He received testosterone and notes that it does help his mood and irritability symptoms.  Additionally helps his libido.  He does note elevated blood pressures recently.  He like to work on weight loss and effort to control his hypertension.  He does have sleep apnea and uses CPAP regularly.   ROS as above:  Exam:  BP (!) 141/89   Pulse 61   Ht 6\' 4"  (1.93 m)   Wt (!) 301 lb (136.5 kg)   BMI 36.64 kg/m  Wt Readings from Last 5 Encounters:  04/08/18 (!) 301 lb (136.5 kg)  01/04/18 286 lb (129.7 kg)  07/04/17 278 lb (126.1 kg)  01/04/17 265 lb 1.9 oz (120.3 kg)  07/06/16 268 lb (121.6 kg)    Gen: Well NAD HEENT: EOMI,  MMM Lungs: Normal work of breathing. CTABL Heart: RRR no MRG Abd: NABS, Soft. Nondistended, Nontender Exts: Brisk capillary refill, warm and well perfused.  Psych alert and oriented normal speech thought process and affect.  No SI or HI expressed. MSK: L-spine nontender to midline.  Tender palpation left lumbar paraspinal musculature.  Normal motion pain with left rotation left lateral flexion.  Lower extremity strength is intact throughout.  Depression screen [facility] 2/9  04/08/2018 01/04/2018 07/04/2017 01/04/2017 11/05/2015  Decreased Interest 1 2 0 0 3  Down, Depressed, Hopeless 1 1 0 0 2  PHQ - 2 Score 2 3 0 0 5  Altered sleeping 1 1 1  - 3  Tired, decreased energy 1 1 0 - 2  Change in appetite 1 2 0 - 2  Feeling bad or failure about yourself  1 1 0 - 3  Trouble concentrating 0 0 0 - 3  Moving slowly or fidgety/restless 0 0 0 - 1  Suicidal thoughts 0 0 0 - 3  PHQ-9 Score 6 8 1  - 22  Difficult doing work/chores Somewhat difficult Somewhat difficult Not difficult at all - Somewhat difficult    GAD 7 : Generalized Anxiety Score 04/08/2018 01/04/2018 07/04/2017 11/05/2015  Nervous, Anxious, on Edge 1 1 0 3  Control/stop worrying 1 1 0 3  Worry too much - different things 1 1 0 3  Trouble relaxing 1 1 0 3  Restless 1 1 0 2  Easily annoyed or irritable 1 2 0 3  Afraid - awful might happen 1 1 0 3  Total GAD 7 Score 7 8 0 20  Anxiety Difficulty Somewhat difficult Somewhat difficult - Somewhat difficult       Lab and Radiology Results Recent Results (from the past 2160 hour(s))  CBC     Status: None   Collection Time: 04/05/18  8:20 AM  Result Value Ref Range  WBC 7.4 3.8 - 10.8 Thousand/uL   RBC 5.20 4.20 - 5.80 Million/uL   Hemoglobin 16.0 13.2 - 17.1 g/dL   HCT 16.1 09.6 - 04.5 %   MCV 89.0 80.0 - 100.0 fL   MCH 30.8 27.0 - 33.0 pg   MCHC 34.6 32.0 - 36.0 g/dL   RDW 40.9 81.1 - 91.4 %   Platelets 214 140 - 400 Thousand/uL   MPV 11.2 7.5 - 12.5 fL  COMPLETE METABOLIC PANEL WITH GFR     Status: Abnormal   Collection Time: 04/05/18  8:20 AM  Result Value Ref Range   Glucose, Bld 99 65 - 99 mg/dL    Comment: .            Fasting reference interval .    BUN 16 7 - 25 mg/dL   Creat 7.82 9.56 - 2.13 mg/dL   GFR, Est Non African American 82 > OR = 60 mL/min/1.71m2   GFR, Est African American 95 > OR = 60 mL/min/1.93m2   BUN/Creatinine Ratio NOT APPLICABLE 6 - 22 (calc)   Sodium 138 135 - 146 mmol/L   Potassium 4.7 3.5 - 5.3 mmol/L    Chloride 103 98 - 110 mmol/L   CO2 27 20 - 32 mmol/L   Calcium 9.1 8.6 - 10.3 mg/dL   Total Protein 6.6 6.1 - 8.1 g/dL   Albumin 4.4 3.6 - 5.1 g/dL   Globulin 2.2 1.9 - 3.7 g/dL (calc)   AG Ratio 2.0 1.0 - 2.5 (calc)   Total Bilirubin 0.5 0.2 - 1.2 mg/dL   Alkaline phosphatase (APISO) 58 36 - 130 U/L   AST 33 10 - 40 U/L   ALT 47 (H) 9 - 46 U/L  Lipid Panel w/reflex Direct LDL     Status: Abnormal   Collection Time: 04/05/18  8:20 AM  Result Value Ref Range   Cholesterol 173 <200 mg/dL   HDL 38 (L) > OR = 40 mg/dL   Triglycerides 086 (H) <150 mg/dL   LDL Cholesterol (Calc) 106 (H) mg/dL (calc)    Comment: Reference range: <100 . Desirable range <100 mg/dL for primary prevention;   <70 mg/dL for patients with CHD or diabetic patients  with > or = 2 CHD risk factors. Marland Kitchen LDL-C is now calculated using the Martin-Hopkins  calculation, which is a validated novel method providing  better accuracy than the Friedewald equation in the  estimation of LDL-C.  Horald Pollen et al. Lenox Ahr. 5784;696(29): 2061-2068  (http://education.[employer].com/faq/FAQ164)    Total CHOL/HDL Ratio 4.6 <5.0 (calc)   Non-HDL Cholesterol (Calc) 135 (H) <130 mg/dL (calc)    Comment: For patients with diabetes plus 1 major ASCVD risk  factor, treating to a non-HDL-C goal of <100 mg/dL  (LDL-C of <52 mg/dL) is considered a therapeutic  option.   PSA     Status: None   Collection Time: 04/05/18  8:20 AM  Result Value Ref Range   PSA 0.8 < OR = 4.0 ng/mL    Comment: The total PSA value from this assay system is  standardized against the WHO standard. The test  result will be approximately 20% lower when compared  to the equimolar-standardized total PSA (Beckman  Coulter). Comparison of serial PSA results should be  interpreted with this fact in mind. . This test was performed using the Siemens  chemiluminescent method. Values obtained from  different assay methods cannot be used interchangeably. PSA  levels, regardless of value, should not be interpreted as absolute evidence  of the presence or absence of disease.   Testosterone     Status: None   Collection Time: 04/05/18  8:20 AM  Result Value Ref Range   Testosterone 655 250 - 827 ng/dL    The 15-VVOH ASCVD risk score Denman George DC Jr., et al., 2013) is: 2.1%   Values used to calculate the score:     Age: 33 years     Sex: Male     Is Non-Hispanic African American: No     Diabetic: No     Tobacco smoker: No     Systolic Blood Pressure: 141 mmHg     Is BP treated: No     HDL Cholesterol: 38 mg/dL     Total Cholesterol: 173 mg/dL    Assessment and Plan: 43 y.o. male with  Obesity: BMI now greater than 35 with several comorbid factors.  Meets criteria for morbid obesity.  Reasonable to refer to comprehensive medical weight management clinic.  Additionally will work on lower carbohydrate and lower calorie diet.  Plan for about 30% of calories in the form of protein and goal of 2000 cal/day.  Continue exercise.  This should result in weight loss.  Recheck with me in 3 months.  Hypertension: Blood pressure elevated today and on previous visits.  Patient would like to work on lifestyle management set of medications at this point.  Recheck 3 months.  Testosterone: Well-managed continue current regimen.  Anxiety/irritability: Well managed with Effexor continue current medication.  Pain: Likely myofascial.  Refer for physical therapy.  PDMP not reviewed this encounter. Orders Placed This Encounter  Procedures  . Ambulatory referral to Family Practice    Referral Priority:   Routine    Referral Type:   Consultation    Referral Reason:   Specialty Services Required    Referred to Provider:   Wilder Glade, MD    Requested Specialty:   Family Medicine    Number of Visits Requested:   1  . Ambulatory referral to Physical Therapy    Referral Priority:   Routine    Referral Type:   Physical Medicine    Referral Reason:    Specialty Services Required    Requested Specialty:   Physical Therapy    Number of Visits Requested:   1   Meds ordered this encounter  Medications  . testosterone cypionate (DEPOTESTOSTERONE CYPIONATE) 200 MG/ML injection    Sig: Inject 1 mL (200 mg total) into the muscle once a week.    Dispense:  12 mL    Refill:  1     Historical information moved to improve visibility of documentation.  Past Medical History:  Diagnosis Date  . Hypogonadism in male 06/26/2014   No past surgical history on file. Social History   Tobacco Use  . Smoking status: Never Smoker  . Smokeless tobacco: Never Used  Substance Use Topics  . Alcohol use: Yes    Alcohol/week: 0.0 standard drinks   family history includes Breast cancer in his mother; Hyperlipidemia in his father; Hypertension in his father.  Medications: Current Outpatient Medications  Medication Sig Dispense Refill  . Krill Oil 1000 MG CAPS Take 1 capsule (1,000 mg total) by mouth daily.    . Multiple Vitamin (MULTIVITAMIN WITH MINERALS) TABS tablet Take 1 tablet by mouth daily.    . Multiple Vitamin (MULTIVITAMIN) capsule Take 1 capsule by mouth daily.    . Needle, Disp, (HYPODERMIC NEEDLE 22GX1-1/2") 22G X 1-1/2" MISC Use to administer testosterone. 100  each 0  . NEEDLE, DISP, 18 G (B-D HYPODERMIC NEEDLE 18GX1.5") 18G X 1-1/2" MISC Use to draw up testosterone. 100 each prn  . Syringe, Disposable, 1 ML MISC Use to administer testosterone. 100 each 0  . testosterone cypionate (DEPOTESTOSTERONE CYPIONATE) 200 MG/ML injection Inject 1 mL (200 mg total) into the muscle once a week. 12 mL 1  . venlafaxine XR (EFFEXOR-XR) 150 MG 24 hr capsule Take 1 capsule (150 mg total) by mouth daily with breakfast. 90 capsule 1   No current facility-administered medications for this visit.    Allergies  Allergen Reactions  . Lidocaine Nausea And Vomiting    Can tolerate Tetracaine  . Tramadol Rash     Discussed warning signs or symptoms. Please  see discharge instructions. Patient expresses understanding.

## 2018-04-08 NOTE — Patient Instructions (Addendum)
Thank you for coming in today.  Work on reduced calories.  Goal 2000 calories per day 30% in calories from protein.   Attend PT.   You should hear from Dr Francena Hanly group about weight.    Recheck with me in 3 months.   Return sooner if needed.     Lumbosacral Strain Lumbosacral strain is an injury that causes pain in the lower back (lumbosacral spine). This injury usually occurs from overstretching the muscles or ligaments along your spine. A strain can affect one or more muscles or cord-like tissues that connect bones to other bones (ligaments). What are the causes? This condition may be caused by:  A hard, direct hit (blow) to the back.  Excessive stretching of the lower back muscles. This may result from: ? A fall. ? Lifting something heavy. ? Repetitive movements such as bending or crouching. What increases the risk? The following factors may increase your risk of getting this condition:  Participating in sports or activities that involve: ? A sudden twist of the back. ? Pushing or pulling motions.  Being overweight or obese.  Having poor strength and flexibility, especially tight hamstrings or weak muscles in the back or abdomen.  Having too much of a curve in the lower back.  Having a pelvis that is tilted forward. What are the signs or symptoms? The main symptom of this condition is pain in the lower back, at the site of the strain. Pain may extend (radiate) down one or both legs. How is this diagnosed? This condition is diagnosed based on:  Your symptoms.  Your medical history.  A physical exam. ? Your health care provider may push on certain areas of your back to determine the source of your pain. ? You may be asked to bend forward, backward, and side to side to assess the severity of your pain and your range of motion.  Imaging tests, such as: ? X-rays. ? MRI.  How is this treated? Treatment for this condition may include:  Putting heat and cold  on the affected area.  Medicines to help relieve pain and relax your muscles (muscle relaxants).  NSAIDs to help reduce swelling and discomfort. When your symptoms improve, it is important to gradually return to your normal routine as soon as possible to reduce pain, avoid stiffness, and avoid loss of muscle strength. Generally, symptoms should improve within 6 weeks of treatment. However, recovery time varies. Follow these instructions at home: Managing pain, stiffness, and swelling   If directed, put ice on the injured area during the first 24 hours after your strain. ? Put ice in a plastic bag. ? Place a towel between your skin and the bag. ? Leave the ice on for 20 minutes, 2-3 times a day.  If directed, put heat on the affected area as often as told by your health care provider. Use the heat source that your health care provider recommends, such as a moist heat pack or a heating pad. ? Place a towel between your skin and the heat source. ? Leave the heat on for 20-30 minutes. ? Remove the heat if your skin turns bright red. This is especially important if you are unable to feel pain, heat, or cold. You may have a greater risk of getting burned. Activity  Rest and return to your normal activities as told by your health care provider. Ask your health care provider what activities are safe for you.  Avoid activities that take a lot of energy for  as long as told by your health care provider. General instructions  Take over-the-counter and prescription medicines only as told by your health care provider.  Donot drive or use heavy machinery while taking prescription pain medicine.  Do not use any products that contain nicotine or tobacco, such as cigarettes and e-cigarettes. If you need help quitting, ask your health care provider.  Keep all follow-up visits as told by your health care provider. This is important. How is this prevented?  Use correct form when playing sports and  lifting heavy objects.  Use good posture when sitting and standing.  Maintain a healthy weight.  Sleep on a mattress with medium firmness to support your back.  Be safe and responsible while being active to avoid falls.  Do at least 150 minutes of moderate-intensity exercise each week, such as brisk walking or water aerobics. Try a form of exercise that takes stress off your back, such as swimming or stationary cycling.  Maintain physical fitness, including: ? Strength. ? Flexibility. ? Cardiovascular fitness. ? Endurance. Contact a health care provider if:  Your back pain does not improve after 6 weeks of treatment.  Your symptoms get worse. Get help right away if:  Your back pain is severe.  You cannot stand or walk.  You have difficulty controlling when you urinate or when you have a bowel movement.  You feel nauseous or you vomit.  Your feet get very cold.  You have numbness, tingling, weakness, or problems using your arms or legs.  You develop any of the following: ? Shortness of breath. ? Dizziness. ? Pain in your legs. ? Weakness in your buttocks or legs. ? Discoloration of the skin on your toes or legs. This information is not intended to replace advice given to you by your health care provider. Make sure you discuss any questions you have with your health care provider. Document Released: 11/16/2004 Document Revised: 08/27/2015 Document Reviewed: 07/11/2015 Elsevier Interactive Patient Education  2019 [employer].

## 2018-04-09 DIAGNOSIS — M6281 Muscle weakness (generalized): Secondary | ICD-10-CM | POA: Diagnosis not present

## 2018-04-09 DIAGNOSIS — M545 Low back pain: Secondary | ICD-10-CM | POA: Diagnosis not present

## 2018-04-12 ENCOUNTER — Encounter: Payer: Self-pay | Admitting: Family Medicine

## 2018-04-15 DIAGNOSIS — M545 Low back pain: Secondary | ICD-10-CM | POA: Diagnosis not present

## 2018-04-15 DIAGNOSIS — M6281 Muscle weakness (generalized): Secondary | ICD-10-CM | POA: Diagnosis not present

## 2018-04-15 MED ORDER — VENLAFAXINE HCL ER 75 MG PO CP24: 75.0000 mg | ORAL_CAPSULE | Freq: Every day | ORAL | 1 refills | Status: DC

## 2018-04-22 DIAGNOSIS — M6281 Muscle weakness (generalized): Secondary | ICD-10-CM | POA: Diagnosis not present

## 2018-04-22 DIAGNOSIS — M545 Low back pain: Secondary | ICD-10-CM | POA: Diagnosis not present

## 2018-04-24 DIAGNOSIS — M6281 Muscle weakness (generalized): Secondary | ICD-10-CM | POA: Diagnosis not present

## 2018-04-24 DIAGNOSIS — M545 Low back pain: Secondary | ICD-10-CM | POA: Diagnosis not present

## 2018-04-29 DIAGNOSIS — M545 Low back pain: Secondary | ICD-10-CM | POA: Diagnosis not present

## 2018-04-29 DIAGNOSIS — M6281 Muscle weakness (generalized): Secondary | ICD-10-CM | POA: Diagnosis not present

## 2018-05-01 DIAGNOSIS — M6281 Muscle weakness (generalized): Secondary | ICD-10-CM | POA: Diagnosis not present

## 2018-05-01 DIAGNOSIS — M545 Low back pain: Secondary | ICD-10-CM | POA: Diagnosis not present

## 2018-05-06 DIAGNOSIS — M6281 Muscle weakness (generalized): Secondary | ICD-10-CM | POA: Diagnosis not present

## 2018-05-06 DIAGNOSIS — M545 Low back pain: Secondary | ICD-10-CM | POA: Diagnosis not present

## 2018-05-08 DIAGNOSIS — M545 Low back pain: Secondary | ICD-10-CM | POA: Diagnosis not present

## 2018-05-08 DIAGNOSIS — M6281 Muscle weakness (generalized): Secondary | ICD-10-CM | POA: Diagnosis not present

## 2018-05-15 DIAGNOSIS — M545 Low back pain: Secondary | ICD-10-CM | POA: Diagnosis not present

## 2018-05-15 DIAGNOSIS — M6281 Muscle weakness (generalized): Secondary | ICD-10-CM | POA: Diagnosis not present

## 2018-05-23 DIAGNOSIS — M545 Low back pain: Secondary | ICD-10-CM | POA: Diagnosis not present

## 2018-05-23 DIAGNOSIS — M6281 Muscle weakness (generalized): Secondary | ICD-10-CM | POA: Diagnosis not present

## 2018-05-23 DIAGNOSIS — G4733 Obstructive sleep apnea (adult) (pediatric): Secondary | ICD-10-CM | POA: Diagnosis not present

## 2018-05-24 DIAGNOSIS — G4733 Obstructive sleep apnea (adult) (pediatric): Secondary | ICD-10-CM | POA: Diagnosis not present

## 2018-05-29 DIAGNOSIS — M545 Low back pain: Secondary | ICD-10-CM | POA: Diagnosis not present

## 2018-05-29 DIAGNOSIS — M6281 Muscle weakness (generalized): Secondary | ICD-10-CM | POA: Diagnosis not present

## 2018-06-05 DIAGNOSIS — M545 Low back pain: Secondary | ICD-10-CM | POA: Diagnosis not present

## 2018-06-05 DIAGNOSIS — M6281 Muscle weakness (generalized): Secondary | ICD-10-CM | POA: Diagnosis not present

## 2018-07-03 ENCOUNTER — Encounter: Payer: Self-pay | Admitting: Family Medicine

## 2018-07-05 ENCOUNTER — Other Ambulatory Visit: Payer: Self-pay | Admitting: Family Medicine

## 2018-07-08 ENCOUNTER — Ambulatory Visit: Payer: Self-pay | Admitting: Family Medicine

## 2018-08-08 ENCOUNTER — Other Ambulatory Visit: Payer: Self-pay | Admitting: Family Medicine

## 2018-08-08 NOTE — Telephone Encounter (Signed)
Left message for patient to call back for an appointment

## 2018-08-19 ENCOUNTER — Encounter: Payer: Self-pay | Admitting: Family Medicine

## 2018-08-19 ENCOUNTER — Ambulatory Visit: Payer: BC Managed Care – PPO | Admitting: Family Medicine

## 2018-08-19 VITALS — BP 146/83 | HR 59 | Temp 97.8°F | Wt 290.0 lb

## 2018-08-19 DIAGNOSIS — Z20822 Contact with and (suspected) exposure to covid-19: Secondary | ICD-10-CM

## 2018-08-19 DIAGNOSIS — E291 Testicular hypofunction: Secondary | ICD-10-CM

## 2018-08-19 DIAGNOSIS — E782 Mixed hyperlipidemia: Secondary | ICD-10-CM | POA: Diagnosis not present

## 2018-08-19 DIAGNOSIS — I10 Essential (primary) hypertension: Secondary | ICD-10-CM | POA: Diagnosis not present

## 2018-08-19 DIAGNOSIS — Z20828 Contact with and (suspected) exposure to other viral communicable diseases: Secondary | ICD-10-CM | POA: Diagnosis not present

## 2018-08-19 NOTE — Patient Instructions (Signed)
Thank you for coming in today. Get labs soon.  Let me know if you need anything.  Keep current medicine.  Track blood pressure.   If all is well recheck in 6 months or sooner if needed.   Keep me updated.   If you have problems getting medicine let me know.

## 2018-08-19 NOTE — Progress Notes (Signed)
Russell Ortega is a 43 y.o. male who presents to [place]: [name] today for hypertension and weight follow-up.  Additionally discuss job.  Patient was seen in mid February.  At that time he had regained weight and his blood pressure had increased.  At that time plan to work on lifestyle modification to decrease weight and improve blood pressure.  In the interim he notes that he was able to lose some weight.  Obviously exercise has been more challenging because of COVID-19.  He notes when he measures blood pressure at home is much better usually less than 120/70.  He does however increase stress.  He was laid off from [employer] on June 17.  He still dealing with the loss of his job.  He does have a severance package that will pay him for a few months and provide his health insurance.  I do see history to figure out what the next few months looks like while he tries to find a new job.  He finds this distressing mildly but overall thinks that he is doing okay.   ROS as above:  Exam:  BP (!) 146/83   Pulse (!) 59   Temp 97.8 F (36.6 C)   Wt 290 lb (131.5 kg)   BMI 35.30 kg/m  Wt Readings from Last 5 Encounters:  08/19/18 290 lb (131.5 kg)  04/08/18 (!) 301 lb (136.5 kg)  01/04/18 286 lb (129.7 kg)  07/04/17 278 lb (126.1 kg)  01/04/17 265 lb 1.9 oz (120.3 kg)    Gen: Well NAD HEENT: EOMI,  MMM Lungs: Normal work of breathing. CTABL Heart: RRR no MRG Abd: NABS, Soft. Nondistended, Nontender Exts: Brisk capillary refill, warm and well perfused.   Lab and Radiology Results No results found for this or any previous visit (from the past 72 hour(s)). No results found.    Assessment and Plan: 43 y.o. male with  Hypertension.  Elevated today but better better controlled at home.  Plan to continue home blood pressure monitoring.  Check metabolic panel today with labs.   Hypo-testosterone: Recheck testosterone and CBC for testosterone monitoring.  Also check COVID-19 antibody.  Patient thinks he may have been exposed in the past.  Continue to discuss obesity management strategies.  PDMP not reviewed this encounter. Orders Placed This Encounter  Procedures  . SAR CoV2 Serology (COVID 19)AB(IGG)IA  . CBC  . COMPLETE METABOLIC PANEL WITH GFR  . Testosterone   No orders of the defined types were placed in this encounter.    Historical information moved to improve visibility of documentation.  Past Medical History:  Diagnosis Date  . Hypogonadism in male 06/26/2014   No past surgical history on file. Social History   Tobacco Use  . Smoking status: Never Smoker  . Smokeless tobacco: Never Used  Substance Use Topics  . Alcohol use: Yes    Alcohol/week: 0.0 standard drinks   family history includes Breast cancer in his mother; Hyperlipidemia in his father; Hypertension in his father.  Medications: Current Outpatient Medications  Medication Sig Dispense Refill  . Krill Oil 1000 MG CAPS Take 1 capsule (1,000 mg total) by mouth daily.    . Multiple Vitamin (MULTIVITAMIN WITH MINERALS) TABS tablet Take 1 tablet by mouth daily.    . Multiple Vitamin (MULTIVITAMIN) capsule Take 1 capsule by mouth daily.    . Needle, Disp, (HYPODERMIC NEEDLE 22GX1-1/2") 22G X 1-1/2" MISC Use to administer testosterone. 100  each 0  . NEEDLE, DISP, 18 G (B-D HYPODERMIC NEEDLE 18GX1.5") 18G X 1-1/2" MISC Use to draw up testosterone. 100 each prn  . Syringe, Disposable, 1 ML MISC Use to administer testosterone. 100 each 0  . testosterone cypionate (DEPOTESTOSTERONE CYPIONATE) 200 MG/ML injection Inject 1 mL (200 mg total) into the muscle once a week. 12 mL 1  . venlafaxine XR (EFFEXOR-XR) 150 MG 24 hr capsule TAKE ONE CAPSULE BY MOUTH DAILY WITH BREAKFAST 15 capsule 0   No current facility-administered medications for this visit.    Allergies  Allergen Reactions  .  Lidocaine Nausea And Vomiting    Can tolerate Tetracaine  . Tramadol Rash     Discussed warning signs or symptoms. Please see discharge instructions. Patient expresses understanding.

## 2018-08-20 DIAGNOSIS — E291 Testicular hypofunction: Secondary | ICD-10-CM | POA: Diagnosis not present

## 2018-08-20 DIAGNOSIS — E782 Mixed hyperlipidemia: Secondary | ICD-10-CM | POA: Diagnosis not present

## 2018-08-20 DIAGNOSIS — I10 Essential (primary) hypertension: Secondary | ICD-10-CM | POA: Diagnosis not present

## 2018-08-20 DIAGNOSIS — Z20828 Contact with and (suspected) exposure to other viral communicable diseases: Secondary | ICD-10-CM | POA: Diagnosis not present

## 2018-08-21 LAB — COMPLETE METABOLIC PANEL WITH GFR
AG Ratio: 2 (calc) (ref 1.0–2.5)
ALT: 34 U/L (ref 9–46)
AST: 23 U/L (ref 10–40)
Albumin: 4.4 g/dL (ref 3.6–5.1)
Alkaline phosphatase (APISO): 66 U/L (ref 36–130)
BUN: 14 mg/dL (ref 7–25)
CO2: 30 mmol/L (ref 20–32)
Calcium: 9.4 mg/dL (ref 8.6–10.3)
Chloride: 103 mmol/L (ref 98–110)
Creat: 1.03 mg/dL (ref 0.60–1.35)
GFR, Est African American: 102 mL/min/{1.73_m2} (ref >=60)
GFR, Est Non African American: 88 mL/min/{1.73_m2} (ref >=60)
Globulin: 2.2 g/dL (calc) (ref 1.9–3.7)
Glucose, Bld: 103 mg/dL — ABNORMAL HIGH (ref 65–99)
Potassium: 4.9 mmol/L (ref 3.5–5.3)
Sodium: 138 mmol/L (ref 135–146)
Total Bilirubin: 0.3 mg/dL (ref 0.2–1.2)
Total Protein: 6.6 g/dL (ref 6.1–8.1)

## 2018-08-21 LAB — SAR COV2 SEROLOGY (COVID19)AB(IGG),IA: SARS CoV2 AB IGG: NEGATIVE

## 2018-08-21 LAB — CBC
HCT: 46 % (ref 38.5–50.0)
Hemoglobin: 15.6 g/dL (ref 13.2–17.1)
MCH: 30.5 pg (ref 27.0–33.0)
MCHC: 33.9 g/dL (ref 32.0–36.0)
MCV: 90 fL (ref 80.0–100.0)
MPV: 10.6 fL (ref 7.5–12.5)
Platelets: 228 10*3/uL (ref 140–400)
RBC: 5.11 10*6/uL (ref 4.20–5.80)
RDW: 13.1 % (ref 11.0–15.0)
WBC: 7.3 10*3/uL (ref 3.8–10.8)

## 2018-08-21 LAB — TESTOSTERONE: Testosterone: 547 ng/dL (ref 250–827)

## 2018-09-05 DIAGNOSIS — G4733 Obstructive sleep apnea (adult) (pediatric): Secondary | ICD-10-CM | POA: Diagnosis not present

## 2018-09-18 ENCOUNTER — Ambulatory Visit (INDEPENDENT_AMBULATORY_CARE_PROVIDER_SITE_OTHER): Payer: Self-pay | Admitting: Family Medicine

## 2018-09-25 ENCOUNTER — Ambulatory Visit (INDEPENDENT_AMBULATORY_CARE_PROVIDER_SITE_OTHER): Payer: Self-pay | Admitting: Family Medicine

## 2018-09-29 ENCOUNTER — Other Ambulatory Visit: Payer: Self-pay | Admitting: Family Medicine

## 2018-09-29 DIAGNOSIS — E291 Testicular hypofunction: Secondary | ICD-10-CM

## 2018-09-30 ENCOUNTER — Other Ambulatory Visit: Payer: Self-pay | Admitting: Family Medicine

## 2018-09-30 DIAGNOSIS — E291 Testicular hypofunction: Secondary | ICD-10-CM

## 2018-10-02 ENCOUNTER — Encounter (INDEPENDENT_AMBULATORY_CARE_PROVIDER_SITE_OTHER): Payer: Self-pay | Admitting: Family Medicine

## 2018-10-02 ENCOUNTER — Other Ambulatory Visit: Payer: Self-pay

## 2018-10-02 ENCOUNTER — Ambulatory Visit (INDEPENDENT_AMBULATORY_CARE_PROVIDER_SITE_OTHER): Payer: BC Managed Care – PPO | Admitting: Family Medicine

## 2018-10-02 VITALS — BP 110/69 | HR 54 | Temp 98.1°F | Ht 76.0 in | Wt 288.0 lb

## 2018-10-02 DIAGNOSIS — G4733 Obstructive sleep apnea (adult) (pediatric): Secondary | ICD-10-CM | POA: Diagnosis not present

## 2018-10-02 DIAGNOSIS — Z9189 Other specified personal risk factors, not elsewhere classified: Secondary | ICD-10-CM | POA: Diagnosis not present

## 2018-10-02 DIAGNOSIS — Z1331 Encounter for screening for depression: Secondary | ICD-10-CM | POA: Diagnosis not present

## 2018-10-02 DIAGNOSIS — F419 Anxiety disorder, unspecified: Secondary | ICD-10-CM

## 2018-10-02 DIAGNOSIS — R5383 Other fatigue: Secondary | ICD-10-CM

## 2018-10-02 DIAGNOSIS — Z0289 Encounter for other administrative examinations: Secondary | ICD-10-CM

## 2018-10-02 DIAGNOSIS — E781 Pure hyperglyceridemia: Secondary | ICD-10-CM | POA: Diagnosis not present

## 2018-10-02 DIAGNOSIS — Z6835 Body mass index (BMI) 35.0-35.9, adult: Secondary | ICD-10-CM

## 2018-10-02 DIAGNOSIS — R0602 Shortness of breath: Secondary | ICD-10-CM | POA: Diagnosis not present

## 2018-10-02 DIAGNOSIS — R7309 Other abnormal glucose: Secondary | ICD-10-CM | POA: Diagnosis not present

## 2018-10-02 NOTE — Progress Notes (Unsigned)
Office: [phone]  /  Fax: [phone]    Date: October 07, 2018   Appointment Start Time:*** Duration:*** Provider: Glennie Ortega, Psy.D. Type of Session: Intake for Individual Therapy  Location of Patient: *** Location of Provider: {Location of Service:[address]} Type of Contact: Telepsychological Visit via [employer] WebEx  Informed Consent: Prior to proceeding with today's appointment, two pieces of identifying information were obtained from Russell Ortega to verify identity. In addition, Russell Ortega's physical location at the time of this appointment was obtained. Russell Ortega reported he was at *** and provided the address. In the event of technical difficulties, Russell Ortega shared a phone number he could be reached at. Russell Ortega and this provider participated in today's telepsychological service. Also, Russell Ortega denied anyone else being present in the room or on the WebEx appointment***.   The provider's role was explained to Russell Ortega. The provider reviewed and discussed issues of confidentiality, privacy, and limits therein (e.g., reporting obligations). In addition to verbal informed consent, written informed consent for psychological services was obtained from Russell Ortega prior to the initial intake interview. Written consent included information concerning the practice, financial arrangements, and confidentiality and patients' rights. Since the clinic is not a 24/7 crisis center, mental health emergency resources were shared, and the provider explained MyChart, e-mail, voicemail, and/or other messaging systems should be utilized only for non-emergency reasons. This provider also explained that information obtained during appointments will be placed in Russell Ortega's medical record in a confidential manner and relevant information will be shared with other providers at [facility] that he meets with for coordination of care. Russell Ortega verbally acknowledged understanding of the aforementioned, and agreed to  use mental health emergency resources discussed if needed. Moreover, [name] agreed information may be shared with other [facility] providers as needed for coordination of care. By signing the service agreement document, Russell Ortega provided written consent for coordination of care.   Prior to initiating telepsychological services, Russell Ortega was provided with an informed consent document, which included the development of a safety plan (i.e., an emergency contact and emergency resources) in the event of an emergency/crisis. Russell Ortega expressed understanding of the rationale of the safety plan and provided consent for this provider to reach out to his emergency contact in the event of an emergency/crisis. Russell Ortega returned the completed consent form prior to today's appointment. This provider verbally reviewed the consent form during today's appointment prior to proceeding with the appointment. Russell Ortega verbally acknowledged understanding that he is ultimately responsible for understanding his insurance benefits as it relates to reimbursement of telepsychological and in-person services. This provider also reviewed confidentiality, as it relates to telepsychological services, as well as the rationale for telepsychological services. More specifically, this provider's clinic is limiting in-person visits due to COVID-19. Therapeutic services will resume to in-person appointments once deemed appropriate. Russell Ortega expressed understanding regarding the rationale for telepsychological services. In addition, this provider explained the telepsychological services informed consent document would be considered an addendum to the initial consent document/service agreement. Russell Ortega verbally consented to proceed.   Chief Complaint/HPI: Russell Ortega was referred by Dr. Ilene Ortega. During the initial appointment with Dr. Ilene Ortega at [facility] on October 02, 2018, Russell Ortega reported experiencing the  following: {HPI Sxs:22137}.   During today's appointment, Russell Ortega reported ***. Russell Ortega was verbally administered a questionnaire assessing various behaviors related to emotional eating. Russell Ortega endorsed the following: {gbmoodandfood:21755}. He shared he craves ***. *** In addition, Russell Ortega {gblegal:22371} a history of binge eating. *** Moreover, Russell Ortega indicated *** triggers emotional  eating, whereas *** makes emotional eating better. Furthermore, Russell Ortega {gblegal:22371} other problems of concern. ***   Mental Status Examination:  Appearance: {Appearance:22431} Behavior: {Behavior:22445} Mood: {Teletherapy mood:22435} Affect: {Affect:22436} Speech: {Speech:22432} Eye Contact: {Eye Contact:22433} Psychomotor Activity: {Motor Activity:22434} Thought Process: {thought process:22448}  Content/Perceptual Disturbances: {disturbances:22451} Orientation: {Orientation:22437} Cognition/Sensorium: {gbcognition:22449} Insight: {Insight:22446} Judgment: {Insight:22446}  Family & Psychosocial History: [name] reported he is ***. He indicated he is currently ***. Additionally, [name] shared his highest level of education obtained is ***. Currently, [name]'s social support system consists of ***. Moreover, [name] stated he resides with ***.   Medical History: ***  Mental Health History: Russell Ortega {gblegal:22371} a history of therapeutic services. Russell Ortega denied a history of hospitalizations for psychiatric concerns, and has never met with a psychiatrist.*** Russell Ortega stated he was *** psychotropic medications. Russell Ortega {gblegal:22371} a family history of mental health related concerns. *** Russell Ortega denied a trauma history, including {gbtrauma:22071} abuse, as well as neglect. ***  Russell Ortega described his typical mood as ***. Aside from concerns noted above and endorsed on the PHQ-9 and GAD-7, [name] reported ***. [name] {gblegal:22371} current alcohol use. *** He {gblegal:22371} tobacco use. *** He  {TGYBWLS:93734} illicit/recreational substance use. Regarding caffeine intake, Glennis reported ***. Furthermore, Jerrit denied experiencing the following: {gbsxs:21965}. He also denied history of and current suicidal ideation, plan, and intent; history of and current homicidal ideation, plan, and intent; and history of and current engagement in self-harm.  The following strengths were reported by Delfino Lovett:*** The following strengths were observed by this provider: {gbstrengths:22223}.  Legal History: Dickey {gblegal:22371} a history of legal involvement.   Structured Assessment Results: The Patient Health Questionnaire-9 (PHQ-9) is a self-report measure that assesses symptoms and severity of depression over the course of the last two weeks. Kinser obtained a score of *** suggesting {GBPHQ9SEVERITY:21752}. Rydge finds the endorsed symptoms to be {gbphq9difficulty:21754}. Little interest or pleasure in doing things ***  Feeling down, depressed, or hopeless ***  Trouble falling or staying asleep, or sleeping too much ***  Feeling tired or having little energy ***  Poor appetite or overeating ***  Feeling bad about yourself --- or that you are a failure or have let yourself or your family down ***  Trouble concentrating on things, such as reading the newspaper or watching television ***  Moving or speaking so slowly that other people could have noticed? Or the opposite --- being so fidgety or restless that you have been moving around a lot more than usual ***  Thoughts that you would be better off dead or hurting yourself in some way ***  PHQ-9 Score ***    The Generalized Anxiety Disorder-7 (GAD-7) is a brief self-report measure that assesses symptoms of anxiety over the course of the last two weeks. Maxi obtained a score of *** suggesting {gbgad7severity:21753}. Devonte finds the endorsed symptoms to be {gbphq9difficulty:21754}. Feeling nervous, anxious, on edge ***  Not being able to stop  or control worrying ***  Worrying too much about different things ***  Trouble relaxing ***  Being so restless that it's hard to sit still ***  Becoming easily annoyed or irritable ***  Feeling afraid as if something awful might happen ***  GAD-7 Score ***   Interventions: A chart review was conducted prior to the clinical intake interview. The PHQ-9, and GAD-7 were verbally administered as well as a Mood and Food questionnaire to assess various behaviors related to emotional eating. Throughout session, empathic reflections and validation was provided. Continuing treatment with this provider was discussed and a  treatment goal was established. Psychoeducation regarding emotional versus physical hunger was provided. Rieley was sent a handout via e-mail to utilize between now and the next appointment to increase awareness of hunger patterns and subsequent eating. Stokes provided verbal consent during today's appointment for this provider to send the handout via e-mail. ***  Provisional DSM-5 Diagnosis: {Diagnoses:22752}  Plan: Shaiden appears able and willing to participate as evidenced by collaboration on a treatment goal, engagement in reciprocal conversation, and asking questions as needed for clarification. The next appointment will be scheduled in {gbweeks:21758}, which will be via [employer]. The following treatment goal was established: {gbtxgoals:21759}. For the aforementioned goal, Carnel can benefit from biweekly individual therapy sessions that are brief in duration for approximately four to six sessions. The treatment modality will be individual therapeutic services, including an eclectic therapeutic approach utilizing techniques from Cognitive Behavioral Therapy, Patient Centered Therapy, Dialectical Behavior Therapy, Acceptance and Commitment Therapy, Interpersonal Therapy, and Cognitive Restructuring. Therapeutic approach will include various interventions as appropriate, such as  validation, support, mindfulness, thought defusion, reframing, psychoeducation, values assessment, and role playing. This provider will regularly review the treatment plan and medical chart to keep informed of status changes. Mukesh expressed understanding and agreement with the initial treatment plan of care.

## 2018-10-03 LAB — INSULIN, RANDOM: INSULIN: 27.8 u[IU]/mL — ABNORMAL HIGH (ref 2.6–24.9)

## 2018-10-03 LAB — LIPID PANEL WITH LDL/HDL RATIO
Cholesterol, Total: 164 mg/dL (ref 100–199)
HDL: 38 mg/dL — ABNORMAL LOW (ref >39)
LDL Calculated: 93 mg/dL (ref 0–99)
LDl/HDL Ratio: 2.4 ratio (ref 0.0–3.6)
Triglycerides: 166 mg/dL — ABNORMAL HIGH (ref 0–149)
VLDL Cholesterol Cal: 33 mg/dL (ref 5–40)

## 2018-10-03 LAB — VITAMIN B12: Vitamin B-12: 549 pg/mL (ref 232–1245)

## 2018-10-03 LAB — HEMOGLOBIN A1C
Est. average glucose Bld gHb Est-mCnc: 105 mg/dL
Hgb A1c MFr Bld: 5.3 % (ref 4.8–5.6)

## 2018-10-03 LAB — FOLATE: Folate: 11.2 ng/mL (ref >3.0)

## 2018-10-03 LAB — T4, FREE: Free T4: 1.03 ng/dL (ref 0.82–1.77)

## 2018-10-03 LAB — T3: T3, Total: 144 ng/dL (ref 71–180)

## 2018-10-03 LAB — TSH: TSH: 2.98 u[IU]/mL (ref 0.450–4.500)

## 2018-10-06 ENCOUNTER — Other Ambulatory Visit: Payer: Self-pay | Admitting: Family Medicine

## 2018-10-07 ENCOUNTER — Encounter (INDEPENDENT_AMBULATORY_CARE_PROVIDER_SITE_OTHER): Payer: Self-pay

## 2018-10-07 ENCOUNTER — Other Ambulatory Visit: Payer: Self-pay

## 2018-10-07 ENCOUNTER — Ambulatory Visit (INDEPENDENT_AMBULATORY_CARE_PROVIDER_SITE_OTHER): Payer: BC Managed Care – PPO | Admitting: Psychology

## 2018-10-07 ENCOUNTER — Telehealth (INDEPENDENT_AMBULATORY_CARE_PROVIDER_SITE_OTHER): Payer: Self-pay | Admitting: Psychology

## 2018-10-07 NOTE — Telephone Encounter (Signed)
  Office: [phone]  /  Fax: [phone]  Date of Call: October 07, 2018  Time of Call: 11:02am Provider: Glennie Isle, PsyD  CONTENT: This provider called Evann to check-in as he did not present for today's Webex appointment at 11:00am. A HIPAA compliant voicemail was left requesting a call back. Of note, this provider stayed on the [facility] appointment for 10 minutes prior to signing off, which is 5 additional minutes past the clinic's 5 minute grace period policy.    PLAN: This provider will wait for Alonzo to call back. If deemed necessary, this provider or the provider's clinic will call Santos again in approximately one week.

## 2018-10-09 ENCOUNTER — Ambulatory Visit (INDEPENDENT_AMBULATORY_CARE_PROVIDER_SITE_OTHER): Payer: Self-pay | Admitting: Family Medicine

## 2018-10-09 NOTE — Progress Notes (Signed)
Office: [phone]  /  Fax: [phone]   Dear Dr. Denyse Amassorey,   Thank you for referring Russell Ortega to our clinic. The following note includes my evaluation and treatment recommendations.  HPI:   Chief Complaint: OBESITY    Russell Ortega has been referred by Russell Ortega, Russell Ortega for consultation regarding his obesity and obesity related comorbidities.    Russell Ortega (MR# [id number]) is a 43 y.o. male who presents on 10/02/2018 for obesity evaluation and treatment. Current BMI is Body mass index is 35.06 kg/m. Russell Ortega has been struggling with his weight for many years and has been unsuccessful in either losing weight, maintaining weight loss, or reaching his healthy weight goal.     Russell Ortega states for breakfast, he is doing a protein bar (low carbohydrate 30 grams of protein), and coffee with creamer (about 2-3 tbsp), 1 coffee a day 20 oz (feels satisfied). Around 12 pm, he is doing a ham sandwich (5 slices) with mustard, raw almonds for snack (1 handful) around 2-3 pm. For dinner, he is doing grilled chicken 5.5 oz, 2 cups of pasta salad, with 4 cups later as a snack.     Russell Ortega attended our information session and states he is currently in the action stage of change and ready to dedicate time achieving and maintaining a healthier weight. Russell BurdockRichard is interested in becoming our patient and working on intensive lifestyle modifications including (but not limited to) diet, exercise and weight loss.    Russell Ortega states his family eats meals together he thinks his family will eat healthier with  him his desired weight loss is 4738 he has been heavy most of  his life he started gaining weight in the last 6 months his heaviest weight ever was 310 lbs he has significant food cravings issues  he snacks frequently in the evenings he skips meals frequently he is frequently drinking liquids with calories he frequently eats larger portions than normal  he struggles with emotional eating     Fatigue Russell Ortega feels his energy is lower than it should be. This has worsened with weight gain and has not worsened recently. Russell Ortega admits to daytime somnolence and  admits to waking up still tired. Patient has a history of obstructive sleep apnea with the use of CPAP. Patent has a history of symptoms of daytime fatigue. Patient generally gets 5 hours of sleep per night, and states they generally have generally restful sleep. Snoring is present. Apneic episodes are present. Epworth Sleepiness Score is 9.  Dyspnea on exertion Russell Ortega notes increasing shortness of breath with exercising and seems to be worsening over time with weight gain. He notes getting out of breath sooner with activity than he used to. This has not gotten worse recently. EKG-sinus bradycardia at 56 BPM. Hisashi denies orthopnea.  Obstructive Sleep Apnea Russell BurdockRichard has a history of obstructive sleep apnea. He wears CPAP 4-5 times a week. He was diagnosed 5 years ago.  At risk for cardiovascular disease Russell BurdockRichard is at a higher than average risk for cardiovascular disease due to obesity and OSA. He currently denies any chest pain.  Anxiety Russell Ortega's symptoms are better controlled with Venlafaxine. He would like to get off Venlafaxine.  Depression Screen Russell Ortega's Food and Mood (modified PHQ-9) score was  Depression screen PHQ 2/9 10/02/2018  Decreased Interest 1  Down, Depressed, Hopeless 0  PHQ - 2 Score 1  Altered sleeping 1  Tired, decreased energy 1  Change in appetite 1  Feeling bad or failure  about yourself  0  Trouble concentrating 0  Moving slowly or fidgety/restless 0  Suicidal thoughts 0  PHQ-9 Score 4  Difficult doing work/chores Not difficult at all    ASSESSMENT AND PLAN:  Other fatigue - Plan: EKG 12-Lead, Hemoglobin A1c, Insulin, random, Vitamin B12, T3, T4, free, TSH, Folate  Shortness of breath on exertion - Plan: Lipid Panel With LDL/HDL Ratio  OSA (obstructive sleep apnea)  Anxiety   Depression screening  At risk for heart disease  Class 2 severe obesity with serious comorbidity and body mass index (BMI) of 35.0 to 35.9 in adult, unspecified obesity type (HCC)  PLAN:  Fatigue Russell Ortega was informed that his fatigue may be related to obesity, depression or many other causes. Labs will be ordered, and in the meanwhile Russell Ortega has agreed to work on diet, exercise and weight loss to help with fatigue. Proper sleep hygiene was discussed including the need for 7-8 hours of quality sleep each night. A sleep study was not ordered based on symptoms and Epworth score.  Dyspnea on exertion Russell Ortega's shortness of breath appears to be obesity related and exercise induced. He has agreed to work on weight loss and gradually increase exercise to treat his exercise induced shortness of breath. If [name] follows our instructions and loses weight without improvement of his shortness of breath, we will plan to refer to pulmonology. We will monitor this condition regularly. Russell Ortega agrees to this plan.  Obstructive Sleep Apnea We will refer to [name] for sleep evaluation. Russell Ortega agrees to follow up with our clinic in 2 weeks.  Cardiovascular risk counseling Russell Ortega was given extended (15 minutes) coronary artery disease prevention counseling today. He is 43 y.o. male and has risk factors for heart disease including obesity and OSA. We discussed intensive lifestyle modifications today with an emphasis on specific weight loss instructions and strategies. Pt was also informed of the importance of increasing exercise and decreasing saturated fats to help prevent heart disease.  Anxiety We will discuss taper of medication at his next appointment. Russell Ortega agrees to follow up with our clinic in 2 weeks.  Depression Screen Russell Ortega had a negative depression screening. Depression is commonly associated with obesity and often results in emotional eating behaviors. We will monitor this closely and work on  CBT to help improve the non-hunger eating patterns. Referral to Psychology may be required if no improvement is seen as he continues in our clinic.  Obesity Hani is currently in the action stage of change and his goal is to continue with weight loss efforts. I recommend Rondle begin the structured treatment plan as follows:  He has agreed to follow the Category 4 plan Miko has been instructed to eventually work up to a goal of 150 minutes of combined cardio and strengthening exercise per week for weight loss and overall health benefits. We discussed the following Behavioral Modification Strategies today: increasing lean protein intake, increasing vegetables and work on meal planning and easy cooking plans, keeping healthy foods in the home, and planning for success   He was informed of the importance of frequent follow up visits to maximize his success with intensive lifestyle modifications for his multiple health conditions. He was informed we would discuss his lab results at his next visit unless there is a critical issue that needs to be addressed sooner. Kurt agreed to keep his next visit at the agreed upon time to discuss these results.  ALLERGIES: Allergies  Allergen Reactions  . Lidocaine Nausea And Vomiting  Can tolerate Tetracaine  . Tramadol Rash    MEDICATIONS: Current Outpatient Medications on File Prior to Visit  Medication Sig Dispense Refill  . Multiple Vitamin (MULTIVITAMIN) capsule Take 1 capsule by mouth daily.    Marland Kitchen testosterone cypionate (DEPOTESTOSTERONE CYPIONATE) 200 MG/ML injection Inject 1 mL (200 mg total) into the muscle once a week. 12 mL 1  . venlafaxine XR (EFFEXOR-XR) 150 MG 24 hr capsule TAKE ONE CAPSULE BY MOUTH DAILY WITH BREAKFAST 15 capsule 0  . BD HYPODERMIC NEEDLE 18G X 1" MISC USE TO DRAW UP TESTOSTERONE 100 each 1  . Krill Oil 1000 MG CAPS Take 1 capsule (1,000 mg total) by mouth daily.    . Multiple Vitamin (MULTIVITAMIN WITH MINERALS)  TABS tablet Take 1 tablet by mouth daily.    . Syringe, Disposable, 1 ML MISC Use to administer testosterone. 100 each 0  . SYRINGE-NEEDLE, DISP, 3 ML (B-D 3CC LUER-LOK SYR 22GX1-1/2) 22G X 1-1/2" 3 ML MISC USE TO ADMINISTER TESTOSTERONE AS DIRECTED 100 each 0   No current facility-administered medications on file prior to visit.     PAST MEDICAL HISTORY: Past Medical History:  Diagnosis Date  . Anxiety   . Back pain   . Hypogonadism in male 06/26/2014  . Sleep apnea     PAST SURGICAL HISTORY: Past Surgical History:  Procedure Laterality Date  . MICRODISCECTOMY LUMBAR      SOCIAL HISTORY: Social History   Tobacco Use  . Smoking status: Never Smoker  . Smokeless tobacco: Never Used  Substance Use Topics  . Alcohol use: Yes    Alcohol/week: 0.0 standard drinks  . Drug use: No    FAMILY HISTORY: Family History  Problem Relation Age of Onset  . Breast cancer Mother   . Hypertension Mother   . Cancer Mother   . Depression Mother   . Anxiety disorder Mother   . Alcoholism Mother   . Obesity Mother   . Hyperlipidemia Father   . Hypertension Father   . Diabetes Father   . Obesity Father   . Sleep apnea Father     ROS: Review of Systems  Constitutional: Positive for malaise/fatigue. Negative for weight loss.       + Trouble sleeping  Respiratory: Positive for shortness of breath (with exertion).   Cardiovascular: Negative for chest pain and orthopnea.  Musculoskeletal: Positive for back pain.  Psychiatric/Behavioral: The patient is nervous/anxious.        + Anxiety + Stress    PHYSICAL EXAM: Blood pressure 110/69, pulse (!) 54, temperature 98.1 F (36.7 C), temperature source Oral, height 6\' 4"  (1.93 m), weight 288 lb (130.6 kg), SpO2 93 %. Body mass index is 35.06 kg/m. Physical Exam Vitals signs reviewed.  Constitutional:      Appearance: Normal appearance. He is obese.  HENT:     Head: Normocephalic and atraumatic.     Nose: Nose normal.  Eyes:      General: No scleral icterus.    Extraocular Movements: Extraocular movements intact.  Neck:     Musculoskeletal: Normal range of motion and neck supple.     Comments: No thyromegaly present Cardiovascular:     Rate and Rhythm: Regular rhythm. Bradycardia present.     Pulses: Normal pulses.     Heart sounds: Normal heart sounds.  Pulmonary:     Effort: Pulmonary effort is normal. No respiratory distress.     Breath sounds: Normal breath sounds.  Abdominal:     Palpations: Abdomen is soft.  Tenderness: There is no abdominal tenderness.     Comments: + Obesity  Musculoskeletal: Normal range of motion.     Right lower leg: No edema.     Left lower leg: No edema.  Skin:    General: Skin is warm and dry.  Neurological:     Mental Status: He is alert and oriented to person, place, and time.     Coordination: Coordination normal.  Psychiatric:        Mood and Affect: Mood normal.        Behavior: Behavior normal.     RECENT LABS AND TESTS: BMET    Component Value Date/Time   NA 138 08/20/2018 0839   K 4.9 08/20/2018 0839   CL 103 08/20/2018 0839   CO2 30 08/20/2018 0839   GLUCOSE 103 (H) 08/20/2018 0839   BUN 14 08/20/2018 0839   CREATININE 1.03 08/20/2018 0839   CALCIUM 9.4 08/20/2018 0839   CALCIUM 9.4 07/06/2014   GFRNONAA 88 08/20/2018 0839   GFRAA 102 08/20/2018 0839   Lab Results  Component Value Date   HGBA1C 5.3 10/02/2018   Lab Results  Component Value Date   INSULIN 27.8 (H) 10/02/2018   CBC    Component Value Date/Time   WBC 7.3 08/20/2018 0839   RBC 5.11 08/20/2018 0839   HGB 15.6 08/20/2018 0839   HCT 46.0 08/20/2018 0839   PLT 228 08/20/2018 0839   MCV 90.0 08/20/2018 0839   MCH 30.5 08/20/2018 0839   MCHC 33.9 08/20/2018 0839   RDW 13.1 08/20/2018 0839   Iron/TIBC/Ferritin/ %Sat No results found for: IRON, TIBC, FERRITIN, IRONPCTSAT Lipid Panel     Component Value Date/Time   CHOL 164 10/02/2018 1302   TRIG 166 (H) 10/02/2018 1302    HDL 38 (L) 10/02/2018 1302   CHOLHDL 4.6 04/05/2018 0820   VLDL 27 12/03/2015 0813   LDLCALC 93 10/02/2018 1302   LDLCALC 106 (H) 04/05/2018 0820   Hepatic Function Panel     Component Value Date/Time   PROT 6.6 08/20/2018 0839   ALBUMIN 4.2 07/18/2016 0805   AST 23 08/20/2018 0839   ALT 34 08/20/2018 0839   ALKPHOS 68 07/18/2016 0805   BILITOT 0.3 08/20/2018 0839      Component Value Date/Time   TSH 2.980 10/02/2018 1302   TSH 1.35 12/03/2015 0813    ECG  shows NSR with a rate of 58 BPM INDIRECT CALORIMETER done today shows a VO2 of 463 and a REE of 3223.  His calculated basal metabolic rate is [id number] thus his basal metabolic rate is better than expected.       OBESITY BEHAVIORAL INTERVENTION VISIT  Today's visit was # 1   Starting weight: 288 lbs Starting date: 10/02/2018 Today's weight : 288 lbs  Today's date: 10/02/2018 Total lbs lost to date: 0    ASK: We discussed the diagnosis of obesity with Russell Ortega today and Rilyn agreed to give us permission to discuss obesity behavioral modification therapy today.  ASSESS: Russell BurdockRichard has the diagnosis of obesity and his BMI today is 35.07 Russell BurdockRichard is in the action stage of change   ADVISE: Russell BurdockRichard was educated on the multiple health risks of obesity as well as the benefit of weight loss to improve his health. He was advised of the need for long term treatment and the importance of lifestyle modifications to improve his current health and to decrease his risk of future health problems.  AGREE: Multiple dietary modification options and  treatment options were discussed and  Gamble agreed to follow the recommendations documented in the above note.  ARRANGE: Russell BurdockRichard was educated on the importance of frequent visits to treat obesity as outlined per CMS and USPSTF guidelines and agreed to schedule his next follow up appointment today.  I, Burt KnackSharon Martin, am acting as transcriptionist for Debbra RidingAlexandria Kadolph, Russell Ortega   I  have reviewed the above documentation for accuracy and completeness, and I agree with the above. - Debbra RidingAlexandria Kadolph, Russell Ortega

## 2018-10-16 ENCOUNTER — Ambulatory Visit (INDEPENDENT_AMBULATORY_CARE_PROVIDER_SITE_OTHER): Payer: Self-pay | Admitting: Family Medicine

## 2018-11-14 DIAGNOSIS — K136 Irritative hyperplasia of oral mucosa: Secondary | ICD-10-CM | POA: Diagnosis not present

## 2018-11-20 ENCOUNTER — Other Ambulatory Visit: Payer: Self-pay | Admitting: Family Medicine

## 2018-11-20 ENCOUNTER — Telehealth: Payer: Self-pay | Admitting: Family Medicine

## 2018-11-20 DIAGNOSIS — E291 Testicular hypofunction: Secondary | ICD-10-CM

## 2018-11-20 NOTE — Telephone Encounter (Signed)
Approved today (testosterone) QGBEEF:[id number];JOITGP:[id number];Review Type:Prior Auth;Coverage Start Date:10/21/2018;Coverage End Date:11/20/2019;. Pharmacy aware.

## 2018-11-20 NOTE — Telephone Encounter (Signed)
Last OV was in June. Please advise.

## 2018-12-06 DIAGNOSIS — G4733 Obstructive sleep apnea (adult) (pediatric): Secondary | ICD-10-CM | POA: Diagnosis not present

## 2019-02-05 DIAGNOSIS — H40012 Open angle with borderline findings, low risk, left eye: Secondary | ICD-10-CM | POA: Diagnosis not present

## 2019-02-13 ENCOUNTER — Other Ambulatory Visit: Payer: Self-pay | Admitting: Family Medicine

## 2019-02-13 DIAGNOSIS — E291 Testicular hypofunction: Secondary | ICD-10-CM

## 2019-02-18 ENCOUNTER — Ambulatory Visit: Payer: BC Managed Care – PPO | Admitting: Family Medicine

## 2019-03-03 ENCOUNTER — Other Ambulatory Visit: Payer: Self-pay | Admitting: Neurology

## 2019-03-03 DIAGNOSIS — E291 Testicular hypofunction: Secondary | ICD-10-CM

## 2019-03-03 MED ORDER — TESTOSTERONE CYPIONATE 200 MG/ML IM SOLN: INTRAMUSCULAR | 1 refills | Status: DC

## 2019-03-03 NOTE — Telephone Encounter (Signed)
Last filled September 30th for a 90 day supply. Testosterone last checked in June.

## 2019-03-24 ENCOUNTER — Other Ambulatory Visit: Payer: Self-pay

## 2019-03-24 ENCOUNTER — Ambulatory Visit: Payer: BC Managed Care – PPO | Admitting: Family Medicine

## 2019-03-24 ENCOUNTER — Encounter: Payer: Self-pay | Admitting: Family Medicine

## 2019-03-24 VITALS — BP 135/86 | HR 61 | Wt 295.0 lb

## 2019-03-24 DIAGNOSIS — F325 Major depressive disorder, single episode, in full remission: Secondary | ICD-10-CM | POA: Diagnosis not present

## 2019-03-24 DIAGNOSIS — E291 Testicular hypofunction: Secondary | ICD-10-CM | POA: Diagnosis not present

## 2019-03-24 MED ORDER — TESTOSTERONE CYPIONATE 200 MG/ML IM SOLN: INTRAMUSCULAR | 4 refills | Status: DC

## 2019-03-24 NOTE — Progress Notes (Signed)
Russell Ortega - 44 y.o. male MRN [id number]  Date of birth: 06-May-1975  Subjective Chief Complaint  Patient presents with  . Medication Management    HPI Russell Ortega is a 44 y.o. male with history of anxiety, low back pain and hypogonadism here today for routine interval follow up.   -Hypogonadism:  Current treatment with testosterone cypionate 200mg  q2 weeks.  He was taking once weekly but he felt that this was too much and he felt funny after each injection including feeling flushed for a couple of days.  Since changing he feels much better and feels like this is working well for him.  He reports good energy levels and libido.   -Anxiety:  Since his last visit he unfortunately lost his job with but has accepted a new position with [employer] which he is looking forward to.  He is currently treated with effexor for management of his anxiety as well.  He is doing well with this and denies any side effects  ROS:  A comprehensive ROS was completed and negative except as noted per HPI  Allergies  Allergen Reactions  . Lidocaine Nausea And Vomiting    Can tolerate Tetracaine  . Tramadol Rash    Past Medical History:  Diagnosis Date  . Anxiety   . Back pain   . Hypogonadism in male 06/26/2014  . Sleep apnea     Past Surgical History:  Procedure Laterality Date  . MICRODISCECTOMY LUMBAR      Social History   Socioeconomic History  . Marital status: Married    Spouse name: Not on file  . Number of children: Not on file  . Years of education: Not on file  . Highest education level: Not on file  Occupational History  . Not on file  Tobacco Use  . Smoking status: Never Smoker  . Smokeless tobacco: Never Used  Substance and Sexual Activity  . Alcohol use: Yes    Alcohol/week: 0.0 standard drinks  . Drug use: No  . Sexual activity: Yes    Partners: Female  Other Topics Concern  . Not on file  Social History Narrative  . Not on file   Social  Determinants of Health   Financial Resource Strain:   . Difficulty of Paying Living Expenses: Not on file  Food Insecurity:   . Worried About 08/26/2014 in the Last Year: Not on file  . Ran Out of Food in the Last Year: Not on file  Transportation Needs:   . Lack of Transportation (Medical): Not on file  . Lack of Transportation (Non-Medical): Not on file  Physical Activity:   . Days of Exercise per Week: Not on file  . Minutes of Exercise per Session: Not on file  Stress:   . Feeling of Stress : Not on file  Social Connections:   . Frequency of Communication with Friends and Family: Not on file  . Frequency of Social Gatherings with Friends and Family: Not on file  . Attends Religious Services: Not on file  . Active Member of Clubs or Organizations: Not on file  . Attends Programme researcher, broadcasting/film/video Meetings: Not on file  . Marital Status: Not on file    Family History  Problem Relation Age of Onset  . Breast cancer Mother   . Hypertension Mother   . Cancer Mother   . Depression Mother   . Anxiety disorder Mother   . Alcoholism Mother   . Obesity Mother   .  Hyperlipidemia Father   . Hypertension Father   . Diabetes Father   . Obesity Father   . Sleep apnea Father     Health Maintenance  Topic Date Due  . INFLUENZA VACCINE  09/21/2018  . TETANUS/TDAP  03/20/2024  . HIV Screening  Completed    ----------------------------------------------------------------------------------------------------------------------------------------------------------------------------------------------------------------- Physical Exam BP 135/86   Pulse 61   Wt 295 lb (133.8 kg)   BMI 35.91 kg/m   Physical Exam Constitutional:      Appearance: Normal appearance.  HENT:     Head: Normocephalic and atraumatic.     Mouth/Throat:     Mouth: Mucous membranes are moist.  Eyes:     General: No scleral icterus. Cardiovascular:     Rate and Rhythm: Normal rate and regular rhythm.   Pulmonary:     Effort: Pulmonary effort is normal.     Breath sounds: Normal breath sounds.  Skin:    General: Skin is warm and dry.  Neurological:     General: No focal deficit present.     Mental Status: He is alert.  Psychiatric:        Mood and Affect: Mood normal.        Behavior: Behavior normal.     ------------------------------------------------------------------------------------------------------------------------------------------------------------------------------------------------------------------- Assessment and Plan  Hypogonadism in male Doing well with testosterone therapy.  Feels better using q2 weeks rather than weekly.  Update testosterone levels and CBC today.   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  He continues to well with effexor, will continue.     This visit occurred during the SARS-CoV-2 public health emergency.  Safety protocols were in place, including screening questions prior to the visit, additional usage of staff PPE, and extensive cleaning of exam room while observing appropriate contact time as indicated for disinfecting solutions.

## 2019-03-24 NOTE — Assessment & Plan Note (Signed)
Doing well with testosterone therapy.  Feels better using q2 weeks rather than weekly.  Update testosterone levels and CBC today.

## 2019-03-24 NOTE — Patient Instructions (Signed)
It was great to meet you today.  Continue current medications We'll be in touch with lab results.  See me again in 6 months.

## 2019-03-24 NOTE — Assessment & Plan Note (Signed)
Lost job recently but coping well and has new job lined up already.  He continues to well with effexor, will continue.

## 2019-03-25 LAB — CBC
HCT: 45.7 % (ref 38.5–50.0)
Hemoglobin: 15.6 g/dL (ref 13.2–17.1)
MCH: 30.1 pg (ref 27.0–33.0)
MCHC: 34.1 g/dL (ref 32.0–36.0)
MCV: 88.1 fL (ref 80.0–100.0)
MPV: 11.4 fL (ref 7.5–12.5)
Platelets: 207 10*3/uL (ref 140–400)
RBC: 5.19 10*6/uL (ref 4.20–5.80)
RDW: 13.3 % (ref 11.0–15.0)
WBC: 7.2 10*3/uL (ref 3.8–10.8)

## 2019-03-25 LAB — TESTOSTERONE: Testosterone: 93 ng/dL — ABNORMAL LOW (ref 250–827)

## 2019-05-19 ENCOUNTER — Encounter: Payer: Self-pay | Admitting: Family Medicine

## 2019-05-22 ENCOUNTER — Other Ambulatory Visit: Payer: Self-pay

## 2019-05-22 ENCOUNTER — Encounter: Payer: Self-pay | Admitting: Family Medicine

## 2019-05-22 ENCOUNTER — Ambulatory Visit (INDEPENDENT_AMBULATORY_CARE_PROVIDER_SITE_OTHER): Payer: Commercial Managed Care - PPO | Admitting: Family Medicine

## 2019-05-22 VITALS — BP 128/78 | HR 59 | Ht 76.0 in | Wt 302.0 lb

## 2019-05-22 DIAGNOSIS — Z0000 Encounter for general adult medical examination without abnormal findings: Secondary | ICD-10-CM

## 2019-05-22 NOTE — Assessment & Plan Note (Signed)
Well adult Orders Placed This Encounter  Procedures  . COMPLETE METABOLIC PANEL WITH GFR  . Lipid Profile  . TSH  Immunizations:  UTD Screenings:  UTD Anticipatory guidance/Risk factor reduction:  Recommendations per AVS

## 2019-05-22 NOTE — Progress Notes (Signed)
Russell Ortega - 44 y.o. male MRN [id number]  Date of birth: 04-27-1975  Subjective Chief Complaint  Patient presents with  . Annual Exam    HPI Russell Ortega is a 44 y.o. male here today for annual exam.  He has  History of HTN, HLD, Low T and depression.  He denies new concerns today.    He is working on lifestyle changes including increasing activity to help with weight loss.    He is a non-smoker.   He consumes EtOH in moderation.   Review of Systems  Constitutional: Negative for chills, fever, malaise/fatigue and weight loss.  HENT: Negative for congestion, ear pain and sore throat.   Eyes: Negative for blurred vision, double vision and pain.  Respiratory: Negative for cough and shortness of breath.   Cardiovascular: Negative for chest pain and palpitations.  Gastrointestinal: Negative for abdominal pain, blood in stool, constipation, heartburn and nausea.  Genitourinary: Negative for dysuria and urgency.  Musculoskeletal: Negative for joint pain and myalgias.  Neurological: Negative for dizziness and headaches.  Endo/Heme/Allergies: Does not bruise/bleed easily.  Psychiatric/Behavioral: Negative for depression. The patient is not nervous/anxious and does not have insomnia.       Allergies  Allergen Reactions  . Lidocaine Nausea And Vomiting    Can tolerate Tetracaine  . Tramadol Rash    Past Medical History:  Diagnosis Date  . Anxiety   . Back pain   . Hypogonadism in male 06/26/2014  . Sleep apnea     Past Surgical History:  Procedure Laterality Date  . MICRODISCECTOMY LUMBAR      Social History   Socioeconomic History  . Marital status: Married    Spouse name: Not on file  . Number of children: Not on file  . Years of education: Not on file  . Highest education level: Not on file  Occupational History  . Not on file  Tobacco Use  . Smoking status: Never Smoker  . Smokeless tobacco: Never Used  Substance and Sexual Activity  . Alcohol use:  Yes    Alcohol/week: 0.0 standard drinks  . Drug use: No  . Sexual activity: Yes    Partners: Female  Other Topics Concern  . Not on file  Social History Narrative  . Not on file   Social Determinants of Health   Financial Resource Strain:   . Difficulty of Paying Living Expenses:   Food Insecurity:   . Worried About Charity fundraiser in the Last Year:   . Arboriculturist in the Last Year:   Transportation Needs:   . Film/video editor (Medical):   Marland Kitchen Lack of Transportation (Non-Medical):   Physical Activity:   . Days of Exercise per Week:   . Minutes of Exercise per Session:   Stress:   . Feeling of Stress :   Social Connections:   . Frequency of Communication with Friends and Family:   . Frequency of Social Gatherings with Friends and Family:   . Attends Religious Services:   . Active Member of Clubs or Organizations:   . Attends Archivist Meetings:   Marland Kitchen Marital Status:     Family History  Problem Relation Age of Onset  . Breast cancer Mother   . Hypertension Mother   . Cancer Mother   . Depression Mother   . Anxiety disorder Mother   . Alcoholism Mother   . Obesity Mother   . Hyperlipidemia Father   . Hypertension Father   .  Diabetes Father   . Obesity Father   . Sleep apnea Father     Health Maintenance  Topic Date Due  . INFLUENZA VACCINE  09/21/2019  . TETANUS/TDAP  03/20/2024  . HIV Screening  Completed     ----------------------------------------------------------------------------------------------------------------------------------------------------------------------------------------------------------------- Physical Exam BP 128/78   Pulse (!) 59   Ht 6\' 4"  (1.93 m)   Wt (!) 302 lb (137 kg)   BMI 36.76 kg/m   Physical Exam Constitutional:      General: He is not in acute distress. HENT:     Head: Normocephalic and atraumatic.     Right Ear: Tympanic membrane and external ear normal.     Left Ear: Tympanic membrane and  external ear normal.  Eyes:     General: No scleral icterus. Neck:     Thyroid: No thyromegaly.  Cardiovascular:     Rate and Rhythm: Normal rate and regular rhythm.     Heart sounds: Normal heart sounds.  Pulmonary:     Effort: Pulmonary effort is normal.     Breath sounds: Normal breath sounds.  Abdominal:     General: Bowel sounds are normal. There is no distension.     Palpations: Abdomen is soft.     Tenderness: There is no abdominal tenderness. There is no guarding.  Musculoskeletal:     Cervical back: Normal range of motion.  Lymphadenopathy:     Cervical: No cervical adenopathy.  Skin:    General: Skin is warm and dry.     Findings: No rash.  Neurological:     General: No focal deficit present.     Mental Status: He is alert and oriented to person, place, and time.     Cranial Nerves: No cranial nerve deficit.     Motor: No abnormal muscle tone.  Psychiatric:        Mood and Affect: Mood normal.        Behavior: Behavior normal.     ------------------------------------------------------------------------------------------------------------------------------------------------------------------------------------------------------------------- Assessment and Plan  Well adult exam Well adult Orders Placed This Encounter  Procedures  . COMPLETE METABOLIC PANEL WITH GFR  . Lipid Profile  . TSH  Immunizations:  UTD Screenings:  UTD Anticipatory guidance/Risk factor reduction:  Recommendations per AVS    No orders of the defined types were placed in this encounter.   Return in about 6 months (around 11/21/2019) for Low T.    This visit occurred during the SARS-CoV-2 public health emergency.  Safety protocols were in place, including screening questions prior to the visit, additional usage of staff PPE, and extensive cleaning of exam room while observing appropriate contact time as indicated for disinfecting solutions.

## 2019-05-22 NOTE — Patient Instructions (Signed)
Preventive Care 41-45 Years Old, Male Preventive care refers to lifestyle choices and visits with your health care provider that can promote health and wellness. This includes:  A yearly physical exam. This is also called an annual well check.  Regular dental and eye exams.  Immunizations.  Screening for certain conditions.  Healthy lifestyle choices, such as eating a healthy diet, getting regular exercise, not using drugs or products that contain nicotine and tobacco, and limiting alcohol use. What can I expect for my preventive care visit? Physical exam Your health care provider will check:  Height and weight. These may be used to calculate body mass index (BMI), which is a measurement that tells if you are at a healthy weight.  Heart rate and blood pressure.  Your skin for abnormal spots. Counseling Your health care provider may ask you questions about:  Alcohol, tobacco, and drug use.  Emotional well-being.  Home and relationship well-being.  Sexual activity.  Eating habits.  Work and work Statistician. What immunizations do I need?  Influenza (flu) vaccine  This is recommended every year. Tetanus, diphtheria, and pertussis (Tdap) vaccine  You may need a Td booster every 10 years. Varicella (chickenpox) vaccine  You may need this vaccine if you have not already been vaccinated. Zoster (shingles) vaccine  You may need this after age 64. Measles, mumps, and rubella (MMR) vaccine  You may need at least one dose of MMR if you were born in 1957 or later. You may also need a second dose. Pneumococcal conjugate (PCV13) vaccine  You may need this if you have certain conditions and were not previously vaccinated. Pneumococcal polysaccharide (PPSV23) vaccine  You may need one or two doses if you smoke cigarettes or if you have certain conditions. Meningococcal conjugate (MenACWY) vaccine  You may need this if you have certain conditions. Hepatitis A  vaccine  You may need this if you have certain conditions or if you travel or work in places where you may be exposed to hepatitis A. Hepatitis B vaccine  You may need this if you have certain conditions or if you travel or work in places where you may be exposed to hepatitis B. Haemophilus influenzae type b (Hib) vaccine  You may need this if you have certain risk factors. Human papillomavirus (HPV) vaccine  If recommended by your health care provider, you may need three doses over 6 months. You may receive vaccines as individual doses or as more than one vaccine together in one shot (combination vaccines). Talk with your health care provider about the risks and benefits of combination vaccines. What tests do I need? Blood tests  Lipid and cholesterol levels. These may be checked every 5 years, or more frequently if you are over 60 years old.  Hepatitis C test.  Hepatitis B test. Screening  Lung cancer screening. You may have this screening every year starting at age 43 if you have a 30-pack-year history of smoking and currently smoke or have quit within the past 15 years.  Prostate cancer screening. Recommendations will vary depending on your family history and other risks.  Colorectal cancer screening. All adults should have this screening starting at age 72 and continuing until age 2. Your health care provider may recommend screening at age 14 if you are at increased risk. You will have tests every 1-10 years, depending on your results and the type of screening test.  Diabetes screening. This is done by checking your blood sugar (glucose) after you have not eaten  for a while (fasting). You may have this done every 1-3 years.  Sexually transmitted disease (STD) testing. Follow these instructions at home: Eating and drinking  Eat a diet that includes fresh fruits and vegetables, whole grains, lean protein, and low-fat dairy products.  Take vitamin and mineral supplements as  recommended by your health care provider.  Do not drink alcohol if your health care provider tells you not to drink.  If you drink alcohol: ? Limit how much you have to 0-2 drinks a day. ? Be aware of how much alcohol is in your drink. In the [place] one drink equals one 12 oz bottle of beer (355 mL), one 5 oz glass of wine (148 mL), or one 1 oz glass of hard liquor (44 mL). Lifestyle  Take daily care of your teeth and gums.  Stay active. Exercise for at least 30 minutes on 5 or more days each week.  Do not use any products that contain nicotine or tobacco, such as cigarettes, e-cigarettes, and chewing tobacco. If you need help quitting, ask your health care provider.  If you are sexually active, practice safe sex. Use a condom or other form of protection to prevent STIs (sexually transmitted infections).  Talk with your health care provider about taking a low-dose aspirin every day starting at age 53. What's next?  Go to your health care provider once a year for a well check visit.  Ask your health care provider how often you should have your eyes and teeth checked.  Stay up to date on all vaccines. This information is not intended to replace advice given to you by your health care provider. Make sure you discuss any questions you have with your health care provider. Document Revised: 01/31/2018 Document Reviewed: 01/31/2018 Elsevier Patient Education  2020 [employer].

## 2019-07-14 ENCOUNTER — Telehealth: Payer: Self-pay | Admitting: Family Medicine

## 2019-07-14 NOTE — Telephone Encounter (Signed)
Received fax for PA on Testosterone sent through cover my meds and received authorization.   Case ID: [id number] Valid: 06/14/2019 - 07/13/2020 - CF

## 2019-09-22 ENCOUNTER — Ambulatory Visit: Payer: BC Managed Care – PPO | Admitting: Family Medicine

## 2019-09-29 ENCOUNTER — Other Ambulatory Visit: Payer: Self-pay | Admitting: Physician Assistant

## 2019-09-29 DIAGNOSIS — E291 Testicular hypofunction: Secondary | ICD-10-CM

## 2019-10-02 ENCOUNTER — Encounter: Payer: Self-pay | Admitting: Family Medicine

## 2019-10-02 ENCOUNTER — Other Ambulatory Visit: Payer: Self-pay

## 2019-10-02 DIAGNOSIS — E291 Testicular hypofunction: Secondary | ICD-10-CM

## 2019-10-02 MED ORDER — TESTOSTERONE CYPIONATE 200 MG/ML IM SOLN: INTRAMUSCULAR | 0 refills | Status: DC

## 2019-10-06 ENCOUNTER — Ambulatory Visit: Payer: Commercial Managed Care - PPO | Admitting: Family Medicine

## 2019-10-08 ENCOUNTER — Ambulatory Visit (INDEPENDENT_AMBULATORY_CARE_PROVIDER_SITE_OTHER): Payer: Commercial Managed Care - PPO | Admitting: Family Medicine

## 2019-10-08 ENCOUNTER — Encounter: Payer: Self-pay | Admitting: Family Medicine

## 2019-10-08 VITALS — BP 132/77 | HR 56 | Wt 298.0 lb

## 2019-10-08 DIAGNOSIS — E782 Mixed hyperlipidemia: Secondary | ICD-10-CM | POA: Diagnosis not present

## 2019-10-08 DIAGNOSIS — I10 Essential (primary) hypertension: Secondary | ICD-10-CM | POA: Diagnosis not present

## 2019-10-08 DIAGNOSIS — E291 Testicular hypofunction: Secondary | ICD-10-CM

## 2019-10-08 NOTE — Progress Notes (Signed)
DEANGLEO PASSAGE - 44 y.o. male MRN [id number]  Date of birth: 1975-08-31  Subjective Chief Complaint  Patient presents with  . low testosterone    HPI Russell Ortega is a 44 y.o. male here today for follow up of low testosterone.  Current treatment with tesosterone cypionate 200mg  q14 days.  Overall he feels pretty good but doesn't feel that current dose lasts a full 14 days.  By day 10 he is starting to feel fatigued and feels like mood is more depressed.  He denies any noticeable side effects related to testosterone use.    He is doing well otherwise.  BP remains well controlled.    ROS:  A comprehensive ROS was completed and negative except as noted per HPI  Allergies  Allergen Reactions  . Lidocaine Nausea And Vomiting    Can tolerate Tetracaine  . Tramadol Rash    Past Medical History:  Diagnosis Date  . Anxiety   . Back pain   . Hypogonadism in male 06/26/2014  . Sleep apnea     Past Surgical History:  Procedure Laterality Date  . MICRODISCECTOMY LUMBAR      Social History   Socioeconomic History  . Marital status: Married    Spouse name: Not on file  . Number of children: Not on file  . Years of education: Not on file  . Highest education level: Not on file  Occupational History  . Not on file  Tobacco Use  . Smoking status: Never Smoker  . Smokeless tobacco: Never Used  Substance and Sexual Activity  . Alcohol use: Yes    Alcohol/week: 0.0 standard drinks  . Drug use: No  . Sexual activity: Yes    Partners: Female  Other Topics Concern  . Not on file  Social History Narrative  . Not on file   Social Determinants of Health   Financial Resource Strain:   . Difficulty of Paying Living Expenses:   Food Insecurity:   . Worried About 08/26/2014 in the Last Year:   . Programme researcher, broadcasting/film/video in the Last Year:   Transportation Needs:   . Barista (Medical):   Freight forwarder Lack of Transportation (Non-Medical):   Physical Activity:   . Days of  Exercise per Week:   . Minutes of Exercise per Session:   Stress:   . Feeling of Stress :   Social Connections:   . Frequency of Communication with Friends and Family:   . Frequency of Social Gatherings with Friends and Family:   . Attends Religious Services:   . Active Member of Clubs or Organizations:   . Attends Marland Kitchen Meetings:   Banker Marital Status:     Family History  Problem Relation Age of Onset  . Breast cancer Mother   . Hypertension Mother   . Cancer Mother   . Depression Mother   . Anxiety disorder Mother   . Alcoholism Mother   . Obesity Mother   . Hyperlipidemia Father   . Hypertension Father   . Diabetes Father   . Obesity Father   . Sleep apnea Father     Health Maintenance  Topic Date Due  . Hepatitis C Screening  Never done  . INFLUENZA VACCINE  05/20/2020 (Originally 09/21/2019)  . COVID-19 Vaccine (1) 10/07/2020 (Originally 05/22/1986)  . TETANUS/TDAP  03/20/2024  . HIV Screening  Completed     ----------------------------------------------------------------------------------------------------------------------------------------------------------------------------------------------------------------- Physical Exam BP 132/77   Pulse (!) 56   Wt  298 lb (135.2 kg)   SpO2 95%   BMI 36.27 kg/m   Physical Exam Constitutional:      Appearance: Normal appearance.  Eyes:     General: No scleral icterus. Cardiovascular:     Rate and Rhythm: Normal rate and regular rhythm.  Pulmonary:     Effort: Pulmonary effort is normal.     Breath sounds: Normal breath sounds.  Neurological:     General: No focal deficit present.     Mental Status: He is alert.  Psychiatric:        Mood and Affect: Mood normal.        Behavior: Behavior normal.      ------------------------------------------------------------------------------------------------------------------------------------------------------------------------------------------------------------------- Assessment and Plan  Hypogonadism in male Update testosterone levels, cbc and psa.  Having some increased symptoms a few days prior to next dose.   If levels aren't too high we can try changing dosing to from every 14 days to every 10 days.      No orders of the defined types were placed in this encounter.   No follow-ups on file.    This visit occurred during the SARS-CoV-2 public health emergency.  Safety protocols were in place, including screening questions prior to the visit, additional usage of staff PPE, and extensive cleaning of exam room while observing appropriate contact time as indicated for disinfecting solutions.

## 2019-10-08 NOTE — Progress Notes (Signed)
Pt reports that he is doing well with Testosterone injections. He does the injections every other week and can tell when it is time for his next injection.

## 2019-10-08 NOTE — Assessment & Plan Note (Signed)
Update testosterone levels, cbc and psa.  Having some increased symptoms a few days prior to next dose.   If levels aren't too high we can try changing dosing to from every 14 days to every 10 days.

## 2019-10-08 NOTE — Patient Instructions (Addendum)
Have labs completed this week. We'll be in touch with results and recommendations.

## 2019-10-10 LAB — COMPLETE METABOLIC PANEL WITH GFR
AG Ratio: 2.2 (calc) (ref 1.0–2.5)
ALT: 34 U/L (ref 9–46)
AST: 20 U/L (ref 10–40)
Albumin: 4.6 g/dL (ref 3.6–5.1)
Alkaline phosphatase (APISO): 77 U/L (ref 36–130)
BUN: 14 mg/dL (ref 7–25)
CO2: 29 mmol/L (ref 20–32)
Calcium: 9.5 mg/dL (ref 8.6–10.3)
Chloride: 101 mmol/L (ref 98–110)
Creat: 1.06 mg/dL (ref 0.60–1.35)
GFR, Est African American: 98 mL/min/{1.73_m2} (ref >=60)
GFR, Est Non African American: 84 mL/min/{1.73_m2} (ref >=60)
Globulin: 2.1 g/dL (calc) (ref 1.9–3.7)
Glucose, Bld: 97 mg/dL (ref 65–99)
Potassium: 4.9 mmol/L (ref 3.5–5.3)
Sodium: 138 mmol/L (ref 135–146)
Total Bilirubin: 0.4 mg/dL (ref 0.2–1.2)
Total Protein: 6.7 g/dL (ref 6.1–8.1)

## 2019-10-10 LAB — LIPID PANEL
Cholesterol: 159 mg/dL (ref <200)
HDL: 33 mg/dL — ABNORMAL LOW (ref >=40)
LDL Cholesterol (Calc): 94 mg/dL (calc)
Non-HDL Cholesterol (Calc): 126 mg/dL (calc) (ref <130)
Total CHOL/HDL Ratio: 4.8 (calc) (ref <5.0)
Triglycerides: 231 mg/dL — ABNORMAL HIGH (ref <150)

## 2019-10-10 LAB — TSH: TSH: 3.16 mIU/L (ref 0.40–4.50)

## 2019-10-10 LAB — PSA: PSA: 0.5 ng/mL (ref <=4.0)

## 2019-10-10 LAB — TESTOSTERONE: Testosterone: 578 ng/dL (ref 250–827)

## 2019-10-15 ENCOUNTER — Other Ambulatory Visit: Payer: Self-pay | Admitting: Family Medicine

## 2019-11-12 ENCOUNTER — Other Ambulatory Visit: Payer: Self-pay | Admitting: Family Medicine

## 2019-11-12 ENCOUNTER — Other Ambulatory Visit: Payer: Self-pay | Admitting: Medical-Surgical

## 2019-11-12 DIAGNOSIS — E291 Testicular hypofunction: Secondary | ICD-10-CM

## 2019-11-12 MED ORDER — TESTOSTERONE CYPIONATE 200 MG/ML IM SOLN: INTRAMUSCULAR | 2 refills | Status: DC

## 2020-04-16 ENCOUNTER — Other Ambulatory Visit: Payer: Self-pay

## 2020-04-16 ENCOUNTER — Encounter: Payer: Self-pay | Admitting: Family Medicine

## 2020-04-16 MED ORDER — VENLAFAXINE HCL ER 75 MG PO CP24: ORAL_CAPSULE | ORAL | 0 refills | Status: DC

## 2020-04-19 ENCOUNTER — Telehealth: Payer: Self-pay

## 2020-04-19 NOTE — Telephone Encounter (Signed)
Pt's spouse Luisantonio Adinolfi lvm stating patient was suicidal and needed to be seen somewhere asap. She was having difficulty getting him scheduled with a therapist.   Returned Mrs. Bartel's call. Per Dr. Ashley Royalty, advised her about [facility] 24/7 walk-in located at [address] [place], [place] and [place].   She expressed understanding. Mrs. Upperman just arrived home and stated she felt today was a better day. She's wondering if her husbands condition could be related to his mother's undiagnosed bipolar disorder.

## 2020-04-21 ENCOUNTER — Encounter: Payer: Self-pay | Admitting: Family Medicine

## 2020-04-21 ENCOUNTER — Other Ambulatory Visit: Payer: Self-pay

## 2020-04-21 ENCOUNTER — Ambulatory Visit (INDEPENDENT_AMBULATORY_CARE_PROVIDER_SITE_OTHER): Payer: Commercial Managed Care - PPO | Admitting: Family Medicine

## 2020-04-21 VITALS — BP 126/76 | HR 86 | Temp 97.7°F | Wt 301.3 lb

## 2020-04-21 DIAGNOSIS — F321 Major depressive disorder, single episode, moderate: Secondary | ICD-10-CM | POA: Diagnosis not present

## 2020-04-21 DIAGNOSIS — G4733 Obstructive sleep apnea (adult) (pediatric): Secondary | ICD-10-CM

## 2020-04-21 DIAGNOSIS — E291 Testicular hypofunction: Secondary | ICD-10-CM

## 2020-04-21 DIAGNOSIS — I10 Essential (primary) hypertension: Secondary | ICD-10-CM | POA: Diagnosis not present

## 2020-04-21 DIAGNOSIS — E782 Mixed hyperlipidemia: Secondary | ICD-10-CM | POA: Diagnosis not present

## 2020-04-21 DIAGNOSIS — Z9989 Dependence on other enabling machines and devices: Secondary | ICD-10-CM

## 2020-04-21 MED ORDER — VENLAFAXINE HCL ER 150 MG PO CP24: ORAL_CAPSULE | ORAL | 1 refills | Status: DC

## 2020-04-21 NOTE — Patient Instructions (Signed)
Great to see you! Increase effexor to 150mg  daily.  Have labs completed.  See me again in 4 weeks.    http://APA.org/depression-guideline"> https://clinicalkey.com"> http://point-of-care.elsevierperformancemanager.com/skills/"> http://point-of-care.elsevierperformancemanager.com">  Managing Depression, Adult Depression is a mental health condition that affects your thoughts, feelings, and actions. Being diagnosed with depression can bring you relief if you did not know why you have felt or behaved a certain way. It could also leave you feeling overwhelmed with uncertainty about your future. Preparing yourself to manage your symptoms can help you feel more positive about your future. How to manage lifestyle changes Managing stress Stress is your body's reaction to life changes and events, both good and bad. Stress can add to your feelings of depression. Learning to manage your stress can help lessen your feelings of depression. Try some of the following approaches to reducing your stress (stress reduction techniques):  Listen to music that you enjoy and that inspires you.  Try using a meditation app or take a meditation class.  Develop a practice that helps you connect with your spiritual self. Walk in nature, pray, or go to a place of worship.  Do some deep breathing. To do this, inhale slowly through your nose. Pause at the top of your inhale for a few seconds and then exhale slowly, letting your muscles relax.  Practice yoga to help relax and work your muscles. Choose a stress reduction technique that suits your lifestyle and personality. These techniques take time and practice to develop. Set aside 5-15 minutes a day to do them. Therapists can offer training in these techniques. Other things you can do to manage stress include:  Keeping a stress diary.  Knowing your limits and saying no when you think something is too much.  Paying attention to how you react to certain situations. You  may not be able to control everything, but you can change your reaction.  Adding humor to your life by watching funny films or TV shows.  Making time for activities that you enjoy and that relax you.   Medicines Medicines, such as antidepressants, are often a part of treatment for depression.  Talk with your pharmacist or health care provider about all the medicines, supplements, and herbal products that you take, their possible side effects, and what medicines and other products are safe to take together.  Make sure to report any side effects you may have to your health care provider. Relationships Your health care provider may suggest family therapy, couples therapy, or individual therapy as part of your treatment. How to recognize changes Everyone responds differently to treatment for depression. As you recover from depression, you may start to:  Have more interest in doing activities.  Feel less hopeless.  Have more energy.  Overeat less often, or have a better appetite.  Have better mental focus. It is important to recognize if your depression is not getting better or is getting worse. The symptoms you had in the beginning may return, such as:  Tiredness (fatigue) or low energy.  Eating too much or too little.  Sleeping too much or too little.  Feeling restless, agitated, or hopeless.  Trouble focusing or making decisions.  Unexplained physical complaints.  Feeling irritable, angry, or aggressive. If you or your family members notice these symptoms coming back, let your health care provider know right away. Follow these instructions at home: Activity  Try to get some form of exercise each day, such as walking, biking, swimming, or lifting weights.  Practice stress reduction techniques.  Engage  your mind by taking a class or doing some volunteer work.   Lifestyle  Get the right amount and quality of sleep.  Cut down on using caffeine, tobacco, alcohol, and  other potentially harmful substances.  Eat a healthy diet that includes plenty of vegetables, fruits, whole grains, low-fat dairy products, and lean protein. Do not eat a lot of foods that are high in solid fats, added sugars, or salt (sodium). General instructions  Take over-the-counter and prescription medicines only as told by your health care provider.  Keep all follow-up visits as told by your health care provider. This is important. Where to find support Talking to others Friends and family members can be sources of support and guidance. Talk to trusted friends or family members about your condition. Explain your symptoms to them, and let them know that you are working with a health care provider to treat your depression. Tell friends and family members how they also can be helpful.   Finances  Find appropriate mental health providers that fit with your financial situation.  Talk with your health care provider about options to get reduced prices on your medicines. Where to find more information You can find support in your area from:  Anxiety and Depression Association of America (ADAA): www.adaa.org  Mental Health America: www.mentalhealthamerica.net  [employer] on Mental Illness: www.nami.org Contact a health care provider if:  You stop taking your antidepressant medicines, and you have any of these symptoms: ? Nausea. ? Headache. ? Light-headedness. ? Chills and body aches. ? Not being able to sleep (insomnia).  You or your friends and family think your depression is getting worse. Get help right away if:  You have thoughts of hurting yourself or others. If you ever feel like you may hurt yourself or others, or have thoughts about taking your own life, get help right away. Go to your nearest emergency department or:  Call your local emergency services (911 in the [place]  Call a suicide crisis helpline, such as the National Suicide Prevention Lifeline at  986 121 4409. This is open 24 hours a day in the [place]  Text the Crisis Text Line at 843-156-1602 (in the [place] Summary  If you are diagnosed with depression, preparing yourself to manage your symptoms is a good way to feel positive about your future.  Work with your health care provider on a management plan that includes stress reduction techniques, medicines (if applicable), therapy, and healthy lifestyle habits.  Keep talking with your health care provider about how your treatment is working.  If you have thoughts about taking your own life, call a suicide crisis helpline or text a crisis text line. This information is not intended to replace advice given to you by your health care provider. Make sure you discuss any questions you have with your health care provider. Document Revised: 12/18/2018 Document Reviewed: 12/18/2018 Elsevier Patient Education  2021 [employer].

## 2020-04-21 NOTE — Assessment & Plan Note (Signed)
Increase effexor to 150mg .  Encouraged to establish with therapist.  Return in about 4 weeks (around 05/19/2020) for Depression.

## 2020-04-21 NOTE — Assessment & Plan Note (Signed)
Update testosterone, cbc and psa 

## 2020-04-21 NOTE — Assessment & Plan Note (Signed)
BP remains well controlled without medication at this time. Recommend low sodium diet.

## 2020-04-21 NOTE — Progress Notes (Signed)
Russell Ortega - 45 y.o. male MRN [id number]  Date of birth: [birth year]  Subjective Chief Complaint  Patient presents with  . Depression    HPI Russell Ortega is a 45 y.o. male here today for follow up of depression.  Reports increased depressive symptoms as well as increased irritability over the past several weeks.  Expressed passive suicidal ideation to his wife a few days ago, feeling like his family would be better off if he wasn't here.  He denies active SI or plan at this time.  Feels isolated from working at home which he feels is worsening symptoms.  He is currently taking effexor 75mg  daily.  He is looking into seeing a therapist as well.    He is taking testosterone as directed.  Feels like current dose is doing pretty well.   He is using CPAP but feels like current pressure setting are not quite right.  He estimates that he has had current machine around 10 years.     Depression screen [facility] 2/9 04/21/2020 04/21/2020 05/22/2019  Decreased Interest 2 2 0  Down, Depressed, Hopeless 2 - 0  PHQ - 2 Score 4 2 0  Altered sleeping - - 1  Tired, decreased energy - - 1  Change in appetite - - 1  Feeling bad or failure about yourself  - - 0  Trouble concentrating - - 0  Moving slowly or fidgety/restless - - 0  Suicidal thoughts - - 0  PHQ-9 Score - - 3  Difficult doing work/chores - - Not difficult at all   GAD 7 : Generalized Anxiety Score 04/21/2020 05/22/2019 04/08/2018 01/04/2018  Nervous, Anxious, on Edge 2 0 1 1  Control/stop worrying 3 0 1 1  Worry too much - different things 3 0 1 1  Trouble relaxing 2 0 1 1  Restless 2 0 1 1  Easily annoyed or irritable 3 0 1 2  Afraid - awful might happen 1 0 1 1  Total GAD 7 Score 16 0 7 8  Anxiety Difficulty Somewhat difficult Not difficult at all Somewhat difficult Somewhat difficult     ROS:  A comprehensive ROS was completed and negative except as noted per HPI  Allergies  Allergen Reactions  . Lidocaine Nausea And Vomiting     Can tolerate Tetracaine  . Tramadol Rash    Past Medical History:  Diagnosis Date  . Anxiety   . Back pain   . Hypogonadism in male 06/26/2014  . Sleep apnea     Past Surgical History:  Procedure Laterality Date  . MICRODISCECTOMY LUMBAR      Social History   Socioeconomic History  . Marital status: Married    Spouse name: Not on file  . Number of children: Not on file  . Years of education: Not on file  . Highest education level: Not on file  Occupational History  . Not on file  Tobacco Use  . Smoking status: Never Smoker  . Smokeless tobacco: Never Used  Substance and Sexual Activity  . Alcohol use: Yes    Alcohol/week: 0.0 standard drinks  . Drug use: No  . Sexual activity: Yes    Partners: Female  Other Topics Concern  . Not on file  Social History Narrative  . Not on file   Social Determinants of Health   Financial Resource Strain: Not on file  Food Insecurity: Not on file  Transportation Needs: Not on file  Physical Activity: Not on file  Stress:  Not on file  Social Connections: Not on file    Family History  Problem Relation Age of Onset  . Breast cancer Mother   . Hypertension Mother   . Cancer Mother   . Depression Mother   . Anxiety disorder Mother   . Alcoholism Mother   . Obesity Mother   . Hyperlipidemia Father   . Hypertension Father   . Diabetes Father   . Obesity Father   . Sleep apnea Father     Health Maintenance  Topic Date Due  . Hepatitis C Screening  Never done  . COLONOSCOPY (Pts 45-58yrs Insurance coverage will need to be confirmed)  Never done  . INFLUENZA VACCINE  05/20/2020 (Originally 09/21/2019)  . COVID-19 Vaccine (1) 10/07/2020 (Originally 05/22/1979)  . TETANUS/TDAP  03/20/2024  . HIV Screening  Completed  . HPV VACCINES  Aged Out      ----------------------------------------------------------------------------------------------------------------------------------------------------------------------------------------------------------------- Physical Exam BP 126/76 (BP Location: Left Arm, Patient Position: Sitting, Cuff Size: Large)   Pulse 86   Temp 97.7 F (36.5 C)   Wt (!) 301 lb 4.8 oz (136.7 kg)   SpO2 96%   BMI 36.68 kg/m   Physical Exam Constitutional:      Appearance: Normal appearance.  HENT:     Head: Normocephalic and atraumatic.  Cardiovascular:     Rate and Rhythm: Normal rate and regular rhythm.  Skin:    General: Skin is warm and dry.  Neurological:     General: No focal deficit present.     Mental Status: He is alert.  Psychiatric:        Mood and Affect: Mood normal.        Behavior: Behavior normal.     ------------------------------------------------------------------------------------------------------------------------------------------------------------------------------------------------------------------- Assessment and Plan  HTN (hypertension) BP remains well controlled without medication at this time. Recommend low sodium diet.   OSA on CPAP Referral placed for sleep medicine.  May need re-titration study.   Hypogonadism in male Update testosterone, cbc and psa.   Depression, major, single episode, moderate (HCC) Increase effexor to 150mg .  Encouraged to establish with therapist.  Return in about 4 weeks (around 05/19/2020) for Depression.    Meds ordered this encounter  Medications  . venlafaxine XR (EFFEXOR-XR) 150 MG 24 hr capsule    Sig: TAKE ONE CAPSULE BY MOUTH DAILY WITH BREAKFAST    Dispense:  90 capsule    Refill:  1    Return in about 4 weeks (around 05/19/2020) for Depression.    This visit occurred during the SARS-CoV-2 public health emergency.  Safety protocols were in place, including screening questions prior to the visit, additional usage of  staff PPE, and extensive cleaning of exam room while observing appropriate contact time as indicated for disinfecting solutions.

## 2020-04-21 NOTE — Assessment & Plan Note (Signed)
Referral placed for sleep medicine.  May need re-titration study.

## 2020-05-11 ENCOUNTER — Encounter: Payer: Self-pay | Admitting: Family Medicine

## 2020-05-11 LAB — COMPLETE METABOLIC PANEL WITH GFR
AG Ratio: 1.9 (calc) (ref 1.0–2.5)
ALT: 40 U/L (ref 9–46)
AST: 25 U/L (ref 10–40)
Albumin: 4.4 g/dL (ref 3.6–5.1)
Alkaline phosphatase (APISO): 77 U/L (ref 36–130)
BUN: 20 mg/dL (ref 7–25)
CO2: 23 mmol/L (ref 20–32)
Calcium: 9.2 mg/dL (ref 8.6–10.3)
Chloride: 105 mmol/L (ref 98–110)
Creat: 0.93 mg/dL (ref 0.60–1.35)
GFR, Est African American: 114 mL/min/{1.73_m2} (ref >=60)
GFR, Est Non African American: 99 mL/min/{1.73_m2} (ref >=60)
Globulin: 2.3 g/dL (calc) (ref 1.9–3.7)
Glucose, Bld: 108 mg/dL — ABNORMAL HIGH (ref 65–99)
Potassium: 4.4 mmol/L (ref 3.5–5.3)
Sodium: 138 mmol/L (ref 135–146)
Total Bilirubin: 0.5 mg/dL (ref 0.2–1.2)
Total Protein: 6.7 g/dL (ref 6.1–8.1)

## 2020-05-11 LAB — CBC
HCT: 43.9 % (ref 38.5–50.0)
Hemoglobin: 14.9 g/dL (ref 13.2–17.1)
MCH: 30.2 pg (ref 27.0–33.0)
MCHC: 33.9 g/dL (ref 32.0–36.0)
MCV: 88.9 fL (ref 80.0–100.0)
MPV: 11 fL (ref 7.5–12.5)
Platelets: 226 10*3/uL (ref 140–400)
RBC: 4.94 10*6/uL (ref 4.20–5.80)
RDW: 13.1 % (ref 11.0–15.0)
WBC: 6.3 10*3/uL (ref 3.8–10.8)

## 2020-05-11 LAB — TSH: TSH: 2.98 mIU/L (ref 0.40–4.50)

## 2020-05-11 LAB — LIPID PANEL W/REFLEX DIRECT LDL
Cholesterol: 157 mg/dL (ref <200)
HDL: 41 mg/dL (ref >=40)
LDL Cholesterol (Calc): 93 mg/dL (calc)
Non-HDL Cholesterol (Calc): 116 mg/dL (calc) (ref <130)
Total CHOL/HDL Ratio: 3.8 (calc) (ref <5.0)
Triglycerides: 126 mg/dL (ref <150)

## 2020-05-11 LAB — PSA: PSA: 0.43 ng/mL (ref <=4.0)

## 2020-05-11 LAB — TESTOSTERONE: Testosterone: 58 ng/dL — ABNORMAL LOW (ref 250–827)

## 2020-05-13 ENCOUNTER — Other Ambulatory Visit: Payer: Self-pay | Admitting: Family Medicine

## 2020-05-13 DIAGNOSIS — E291 Testicular hypofunction: Secondary | ICD-10-CM

## 2020-05-19 ENCOUNTER — Ambulatory Visit: Payer: Commercial Managed Care - PPO | Admitting: Family Medicine

## 2020-06-01 ENCOUNTER — Ambulatory Visit: Payer: Commercial Managed Care - PPO | Admitting: Neurology

## 2020-06-01 ENCOUNTER — Other Ambulatory Visit: Payer: Self-pay

## 2020-06-01 ENCOUNTER — Encounter: Payer: Self-pay | Admitting: Neurology

## 2020-06-01 ENCOUNTER — Telehealth: Payer: Self-pay | Admitting: Neurology

## 2020-06-01 NOTE — Telephone Encounter (Signed)
Patient needs a call to reschedule his sleep consult from today. He said he needed to go back to work and could not stay for his appointment.

## 2020-07-05 ENCOUNTER — Encounter: Payer: Self-pay | Admitting: Family Medicine

## 2020-07-05 ENCOUNTER — Other Ambulatory Visit: Payer: Self-pay

## 2020-07-05 ENCOUNTER — Ambulatory Visit (INDEPENDENT_AMBULATORY_CARE_PROVIDER_SITE_OTHER): Payer: Commercial Managed Care - PPO | Admitting: Family Medicine

## 2020-07-05 VITALS — BP 132/76 | HR 72 | Temp 97.9°F | Ht 76.0 in | Wt 301.4 lb

## 2020-07-05 DIAGNOSIS — Z9989 Dependence on other enabling machines and devices: Secondary | ICD-10-CM

## 2020-07-05 DIAGNOSIS — Z1211 Encounter for screening for malignant neoplasm of colon: Secondary | ICD-10-CM | POA: Diagnosis not present

## 2020-07-05 DIAGNOSIS — G4733 Obstructive sleep apnea (adult) (pediatric): Secondary | ICD-10-CM

## 2020-07-05 DIAGNOSIS — K625 Hemorrhage of anus and rectum: Secondary | ICD-10-CM | POA: Diagnosis not present

## 2020-07-05 NOTE — Assessment & Plan Note (Signed)
Likely due to peri-anal irritation.  Discussed using wet wipes as needed.  Increase fluid and fiber intake.   Referral for colon cancer screening as well.

## 2020-07-05 NOTE — Progress Notes (Signed)
Russell Ortega - 45 y.o. male MRN [id number]  Date of birth: [birth year]  Subjective Chief Complaint  Patient presents with  . Rectal Bleeding    HPI Russell Ortega is a 45 y.o. male here today with complaint of rectal bleeding.  This occurred a few days ago and only lasted for one day.  Blood was mostly on toilet tissue but did notice some in the toilet.  He denies pain with bowel movements, abdominal pain, fever, chills or nausea.  He has never had colon cancer screening.  Reports that bowel movements are pretty normal.  A little loose at times.  Has been under more stress related to his job and isn't sure if this may be contributing.    ROS:  A comprehensive ROS was completed and negative except as noted per HPI  Allergies  Allergen Reactions  . Lidocaine Nausea And Vomiting    Can tolerate Tetracaine  . Tramadol Rash    Past Medical History:  Diagnosis Date  . Anxiety   . Back pain   . Hypogonadism in male 06/26/2014  . Sleep apnea     Past Surgical History:  Procedure Laterality Date  . MICRODISCECTOMY LUMBAR      Social History   Socioeconomic History  . Marital status: Married    Spouse name: Not on file  . Number of children: Not on file  . Years of education: Not on file  . Highest education level: Not on file  Occupational History  . Not on file  Tobacco Use  . Smoking status: Never Smoker  . Smokeless tobacco: Never Used  Substance and Sexual Activity  . Alcohol use: Yes    Alcohol/week: 0.0 standard drinks  . Drug use: No  . Sexual activity: Yes    Partners: Female  Other Topics Concern  . Not on file  Social History Narrative  . Not on file   Social Determinants of Health   Financial Resource Strain: Not on file  Food Insecurity: Not on file  Transportation Needs: Not on file  Physical Activity: Not on file  Stress: Not on file  Social Connections: Not on file    Family History  Problem Relation Age of Onset  . Breast cancer Mother    . Hypertension Mother   . Cancer Mother   . Depression Mother   . Anxiety disorder Mother   . Alcoholism Mother   . Obesity Mother   . Hyperlipidemia Father   . Hypertension Father   . Diabetes Father   . Obesity Father   . Sleep apnea Father     Health Maintenance  Topic Date Due  . Hepatitis C Screening  Never done  . COLONOSCOPY (Pts 45-40yrs Insurance coverage will need to be confirmed)  Never done  . COVID-19 Vaccine (1) 10/07/2020 (Originally 05/22/1979)  . INFLUENZA VACCINE  09/20/2020  . TETANUS/TDAP  03/20/2024  . HIV Screening  Completed  . HPV VACCINES  Aged Out     ----------------------------------------------------------------------------------------------------------------------------------------------------------------------------------------------------------------- Physical Exam BP 132/76 (BP Location: Left Arm, Patient Position: Sitting, Cuff Size: Large)   Pulse 72   Temp 97.9 F (36.6 C)   Ht 6\' 4"  (1.93 m)   Wt (!) 301 lb 6.4 oz (136.7 kg)   SpO2 92%   BMI 36.69 kg/m   Physical Exam Constitutional:      Appearance: Normal appearance.  Eyes:     General: No scleral icterus. Abdominal:     General: Abdomen is flat. There is no  distension.     Tenderness: There is no abdominal tenderness. There is no guarding.  Genitourinary:    Comments: Mild peri-anal irritation.  No hemorrhoids noted. No fissures.  Musculoskeletal:     Cervical back: Neck supple.  Neurological:     Mental Status: He is alert.  Psychiatric:        Mood and Affect: Mood normal.        Behavior: Behavior normal.     ------------------------------------------------------------------------------------------------------------------------------------------------------------------------------------------------------------------- Assessment and Plan  OSA on CPAP Needing re-titration. Referral placed to Dr. Jerre Simon in [place].   Rectal bleeding Likely due to peri-anal  irritation.  Discussed using wet wipes as needed.  Increase fluid and fiber intake.   Referral for colon cancer screening as well.     No orders of the defined types were placed in this encounter.   No follow-ups on file.    This visit occurred during the SARS-CoV-2 public health emergency.  Safety protocols were in place, including screening questions prior to the visit, additional usage of staff PPE, and extensive cleaning of exam room while observing appropriate contact time as indicated for disinfecting solutions.

## 2020-07-05 NOTE — Patient Instructions (Signed)
Nice to see you today! Be sure to get plenty of fluids and fiber in your diet.  Try using wet wipes intermittently for irritation.  Let me know if symptoms worsen.

## 2020-07-05 NOTE — Assessment & Plan Note (Signed)
Needing re-titration. Referral placed to Dr. Jerre Simon in [place].

## 2020-07-27 NOTE — Progress Notes (Unsigned)
NOt being seen _ no visit charges or diagnosis codes.

## 2020-08-27 ENCOUNTER — Other Ambulatory Visit: Payer: Self-pay | Admitting: Family Medicine

## 2020-08-27 DIAGNOSIS — E291 Testicular hypofunction: Secondary | ICD-10-CM

## 2020-08-27 MED ORDER — TESTOSTERONE CYPIONATE 200 MG/ML IM SOLN: INTRAMUSCULAR | 1 refills | Status: DC

## 2020-09-13 ENCOUNTER — Telehealth: Payer: Self-pay

## 2020-09-13 NOTE — Telephone Encounter (Signed)
PA for testosterone ypionate 200mg /mL submitted via CoverMyMeds Awaiting response - 72 hour wait

## 2020-09-23 NOTE — Telephone Encounter (Signed)
Medication: testosterone cypionate 200 mg/ml vial Prior authorization determination received Medication has been denied Reason for denial:  "Coverage is provided in situations where the patient has had persistent signs and symptoms (for example, depressed mood, decreased energy, progressive decrease in muscle mass, osteoporosis, loss of libido) of androgen deficiency (pre-treatment). Coverage cannot be authorized at this time."

## 2020-09-23 NOTE — Telephone Encounter (Signed)
Per verbal information from Dr. Ashley Royalty, pt does have a hx of decreased libido and depression. Previous labs show pre-treatment testosterone was 86 on 09/30/07 and 171 on 09/16/07. Per CoverMyMeds:  "[employer] is reviewing your PA request and will respond within 24 hours for Medicaid or up to 72 hours for non-Medicaid plans, based on the required timeframe determined by state or federal regulations. To check for an update later, open this request from your dashboard."  Awaiting response

## 2020-09-27 NOTE — Telephone Encounter (Signed)
Paperwork received stating PA was denied. I called [employer] and they said it was denied because they did not received all of the info needed for the PA. They sent forms to be completed and I faxed them back along with medical records for the approval. Fax confirmation received that the fax went through fine.

## 2020-10-01 NOTE — Telephone Encounter (Signed)
Medication: testosterone cypionate 200mg /mL, 1 mL q10d Prior authorization determination received Medication has been approved Approved from 08/28/20-09/30/21 for 2 vials per 20 days.  Pt aware.  Pharmacy aware.

## 2020-11-01 ENCOUNTER — Ambulatory Visit: Payer: BC Managed Care – PPO | Admitting: Family Medicine

## 2020-11-02 ENCOUNTER — Encounter: Payer: Self-pay | Admitting: Family Medicine

## 2020-11-02 DIAGNOSIS — E291 Testicular hypofunction: Secondary | ICD-10-CM

## 2020-11-02 NOTE — Telephone Encounter (Signed)
When does pt need his testosterone levels rechecked?  Please advise.  Tiajuana Amass, CMA

## 2020-11-03 ENCOUNTER — Other Ambulatory Visit: Payer: Self-pay | Admitting: Family Medicine

## 2020-11-03 DIAGNOSIS — E291 Testicular hypofunction: Secondary | ICD-10-CM

## 2020-11-03 MED ORDER — TESTOSTERONE CYPIONATE 200 MG/ML IM SOLN: INTRAMUSCULAR | 1 refills | Status: DC

## 2020-11-03 NOTE — Telephone Encounter (Signed)
Recheck testosterone in 8-10 weeks

## 2020-11-08 DIAGNOSIS — Z1211 Encounter for screening for malignant neoplasm of colon: Secondary | ICD-10-CM | POA: Diagnosis not present

## 2020-11-08 LAB — HM COLONOSCOPY

## 2020-11-11 ENCOUNTER — Ambulatory Visit: Payer: BC Managed Care – PPO | Admitting: Family Medicine

## 2020-11-11 ENCOUNTER — Encounter: Payer: Self-pay | Admitting: Family Medicine

## 2020-11-11 VITALS — BP 121/83 | HR 60 | Ht 76.0 in | Wt 306.0 lb

## 2020-11-11 DIAGNOSIS — E291 Testicular hypofunction: Secondary | ICD-10-CM | POA: Diagnosis not present

## 2020-11-11 DIAGNOSIS — F321 Major depressive disorder, single episode, moderate: Secondary | ICD-10-CM | POA: Diagnosis not present

## 2020-11-11 DIAGNOSIS — I10 Essential (primary) hypertension: Secondary | ICD-10-CM

## 2020-11-11 DIAGNOSIS — Z5181 Encounter for therapeutic drug level monitoring: Secondary | ICD-10-CM | POA: Diagnosis not present

## 2020-11-11 LAB — CBC WITH DIFFERENTIAL/PLATELET
Absolute Monocytes: 539 cells/uL (ref 200–950)
Basophils Absolute: 77 cells/uL (ref 0–200)
Basophils Relative: 1 %
Eosinophils Absolute: 162 cells/uL (ref 15–500)
Eosinophils Relative: 2.1 %
HCT: 44.1 % (ref 38.5–50.0)
Hemoglobin: 14.9 g/dL (ref 13.2–17.1)
Lymphs Abs: 2387 cells/uL (ref 850–3900)
MCH: 29.6 pg (ref 27.0–33.0)
MCHC: 33.8 g/dL (ref 32.0–36.0)
MCV: 87.7 fL (ref 80.0–100.0)
MPV: 11 fL (ref 7.5–12.5)
Monocytes Relative: 7 %
Neutro Abs: 4535 cells/uL (ref 1500–7800)
Neutrophils Relative %: 58.9 %
Platelets: 213 10*3/uL (ref 140–400)
RBC: 5.03 10*6/uL (ref 4.20–5.80)
RDW: 13.4 % (ref 11.0–15.0)
Total Lymphocyte: 31 %
WBC: 7.7 10*3/uL (ref 3.8–10.8)

## 2020-11-11 LAB — TESTOSTERONE: Testosterone: 624 ng/dL (ref 250–827)

## 2020-11-11 LAB — PSA: PSA: 0.61 ng/mL (ref <=4.00)

## 2020-11-11 MED ORDER — VENLAFAXINE HCL ER 75 MG PO CP24: ORAL_CAPSULE | ORAL | 1 refills | Status: DC

## 2020-11-11 NOTE — Progress Notes (Signed)
Russell Ortega - 45 y.o. male MRN [id number]  Date of birth: [birth year]  Subjective Chief Complaint  Patient presents with   Follow-up    HPI Russell Ortega is a 45 year old male here today for follow-up visit.    He is interested in reducing his Effexor dosing.  Feels like he is becoming more emotionally numb at current dose.  He did recently start a new job with [employer].  He had previously worked here and had been working towards rejoining them for the past couple of years.  He does continue to deal with stress but overall feels like he is able to manage this better.  He has recently restarted his testosterone supplementation.  Feels pretty good with current dosing.  He has had some issues with insurance approval of this due to starting new insurance.  ROS:  A comprehensive ROS was completed and negative except as noted per HPI  Allergies  Allergen Reactions   Lidocaine Nausea And Vomiting    Can tolerate Tetracaine   Tramadol Rash    Past Medical History:  Diagnosis Date   Anxiety    Back pain    Hypogonadism in male 06/26/2014   Sleep apnea     Past Surgical History:  Procedure Laterality Date   MICRODISCECTOMY LUMBAR      Social History   Socioeconomic History   Marital status: Married    Spouse name: Not on file   Number of children: Not on file   Years of education: Not on file   Highest education level: Not on file  Occupational History   Not on file  Tobacco Use   Smoking status: Never   Smokeless tobacco: Never  Substance and Sexual Activity   Alcohol use: Yes    Alcohol/week: 0.0 standard drinks   Drug use: No   Sexual activity: Yes    Partners: Female  Other Topics Concern   Not on file  Social History Narrative   Not on file   Social Determinants of Health   Financial Resource Strain: Not on file  Food Insecurity: Not on file  Transportation Needs: Not on file  Physical Activity: Not on file  Stress: Not on file  Social Connections: Not on file     Family History  Problem Relation Age of Onset   Breast cancer Mother    Hypertension Mother    Cancer Mother    Depression Mother    Anxiety disorder Mother    Alcoholism Mother    Obesity Mother    Hyperlipidemia Father    Hypertension Father    Diabetes Father    Obesity Father    Sleep apnea Father     Health Maintenance  Topic Date Due   Hepatitis C Screening  Never done   COVID-19 Vaccine (3 - Booster for Moderna series) 11/27/2020 (Originally 02/29/2020)   INFLUENZA VACCINE  05/20/2021 (Originally 09/20/2020)   TETANUS/TDAP  03/20/2024   COLONOSCOPY (Pts 45-32yrs Insurance coverage will need to be confirmed)  11/09/2030   HIV Screening  Completed   HPV VACCINES  Aged Out     ----------------------------------------------------------------------------------------------------------------------------------------------------------------------------------------------------------------- Physical Exam BP 121/83   Pulse 60   Ht 6\' 4"  (1.93 m)   Wt (!) 306 lb (138.8 kg)   SpO2 96%   BMI 37.25 kg/m   Physical Exam Constitutional:      Appearance: Normal appearance.  HENT:     Head: Normocephalic and atraumatic.  Eyes:     General: No scleral icterus. Cardiovascular:  Rate and Rhythm: Normal rate and regular rhythm.  Pulmonary:     Effort: Pulmonary effort is normal.     Breath sounds: Normal breath sounds.  Musculoskeletal:     Cervical back: Neck supple.  Neurological:     General: No focal deficit present.     Mental Status: He is alert.  Psychiatric:        Mood and Affect: Mood normal.        Behavior: Behavior normal.    ------------------------------------------------------------------------------------------------------------------------------------------------------------------------------------------------------------------- Assessment and Plan  HTN (hypertension) Blood pressure is well controlled without medication.  He will continue to  follow a low-sodium diet with regular activity.  Hypogonadism in male Recently restarted testosterone supplementation.  He is doing well with that so far.  Depression, major, single episode, moderate (HCC) Reducing Effexor to 75 mg daily.   Meds ordered this encounter  Medications   venlafaxine XR (EFFEXOR-XR) 75 MG 24 hr capsule    Sig: TAKE ONE CAPSULE BY MOUTH DAILY WITH BREAKFAST    Dispense:  90 capsule    Refill:  1    Return in about 3 months (around 02/10/2021) for Anxiety/mood.    This visit occurred during the SARS-CoV-2 public health emergency.  Safety protocols were in place, including screening questions prior to the visit, additional usage of staff PPE, and extensive cleaning of exam room while observing appropriate contact time as indicated for disinfecting solutions.

## 2020-11-11 NOTE — Assessment & Plan Note (Signed)
Reducing Effexor to 75 mg daily.

## 2020-11-11 NOTE — Assessment & Plan Note (Signed)
Blood pressure is well controlled without medication.  He will continue to follow a low-sodium diet with regular activity.

## 2020-11-11 NOTE — Assessment & Plan Note (Signed)
Recently restarted testosterone supplementation.  He is doing well with that so far.

## 2020-11-12 ENCOUNTER — Encounter: Payer: Self-pay | Admitting: Family Medicine

## 2020-11-12 ENCOUNTER — Telehealth: Payer: Self-pay

## 2020-11-12 NOTE — Telephone Encounter (Signed)
Abstract completed

## 2020-11-17 ENCOUNTER — Telehealth: Payer: Self-pay

## 2020-11-17 NOTE — Telephone Encounter (Signed)
Medication: testosterone cypionate (DEPOTESTOSTERONE CYPIONATE) 200 MG/ML injection Prior authorization submitted via CoverMyMeds on 11/17/2020 PA submission pending

## 2020-11-18 NOTE — Telephone Encounter (Signed)
Medication: testosterone cypionate (DEPOTESTOSTERONE CYPIONATE) 200 MG/ML injection Prior authorization determination received Medication has been denied Reason for denial:  Coverage is provided when documentation has been provided top confirm the patient has had at least one pre-treatment serum testosterone (total or bioavailable) level. Coverage cannot be authorized at this time.

## 2020-11-18 NOTE — Telephone Encounter (Signed)
We have been through this previously with him.  His Pre-treatment levels were low.  I think they are on care everywhere form [facility]

## 2020-11-19 NOTE — Telephone Encounter (Signed)
Medication: testosterone cypionate (DEPOTESTOSTERONE CYPIONATE) 200 MG/ML injection Prior authorization determination received Medication has been approved Approval dates: 10/20/2020-11/09/2021  Patient aware via: MyChart Pharmacy aware: Yes Provider aware via this encounter

## 2020-11-19 NOTE — Telephone Encounter (Signed)
I spoke with [employer] because we received a PA for testosterone cypionate (DEPOTESTOSTERONE CYPIONATE) 200 MG/ML injection previously from 08/28/20-09/30/21. When I spoke with Leilani Merl, she said that the pt now has new insurance coverage so a PA needs to be done again.  Medication: testosterone cypionate (DEPOTESTOSTERONE CYPIONATE) 200 MG/ML injection Prior authorization submitted via CoverMyMeds on 11/19/2020 PA submission pending

## 2020-12-01 DIAGNOSIS — K136 Irritative hyperplasia of oral mucosa: Secondary | ICD-10-CM | POA: Diagnosis not present

## 2020-12-30 DIAGNOSIS — Z9989 Dependence on other enabling machines and devices: Secondary | ICD-10-CM | POA: Diagnosis not present

## 2020-12-30 DIAGNOSIS — G4710 Hypersomnia, unspecified: Secondary | ICD-10-CM | POA: Diagnosis not present

## 2020-12-30 DIAGNOSIS — G4733 Obstructive sleep apnea (adult) (pediatric): Secondary | ICD-10-CM | POA: Diagnosis not present

## 2021-01-17 DIAGNOSIS — M25521 Pain in right elbow: Secondary | ICD-10-CM | POA: Diagnosis not present

## 2021-01-17 DIAGNOSIS — M25531 Pain in right wrist: Secondary | ICD-10-CM | POA: Diagnosis not present

## 2021-01-28 DIAGNOSIS — M25521 Pain in right elbow: Secondary | ICD-10-CM | POA: Diagnosis not present

## 2021-01-28 DIAGNOSIS — M25531 Pain in right wrist: Secondary | ICD-10-CM | POA: Diagnosis not present

## 2021-01-31 DIAGNOSIS — G4733 Obstructive sleep apnea (adult) (pediatric): Secondary | ICD-10-CM | POA: Diagnosis not present

## 2021-02-02 DIAGNOSIS — M25521 Pain in right elbow: Secondary | ICD-10-CM | POA: Diagnosis not present

## 2021-02-02 DIAGNOSIS — M25531 Pain in right wrist: Secondary | ICD-10-CM | POA: Diagnosis not present

## 2021-02-09 DIAGNOSIS — M25531 Pain in right wrist: Secondary | ICD-10-CM | POA: Diagnosis not present

## 2021-02-09 DIAGNOSIS — M25521 Pain in right elbow: Secondary | ICD-10-CM | POA: Diagnosis not present

## 2021-02-10 ENCOUNTER — Other Ambulatory Visit: Payer: Self-pay

## 2021-02-10 ENCOUNTER — Other Ambulatory Visit: Payer: Self-pay | Admitting: Family Medicine

## 2021-02-10 ENCOUNTER — Encounter: Payer: Self-pay | Admitting: Family Medicine

## 2021-02-10 ENCOUNTER — Ambulatory Visit: Payer: BC Managed Care – PPO | Admitting: Family Medicine

## 2021-02-10 DIAGNOSIS — E291 Testicular hypofunction: Secondary | ICD-10-CM

## 2021-02-10 DIAGNOSIS — F321 Major depressive disorder, single episode, moderate: Secondary | ICD-10-CM | POA: Diagnosis not present

## 2021-02-10 NOTE — Progress Notes (Signed)
Russell Ortega - 45 y.o. male MRN [id number]  Date of birth: 1975-09-24  Subjective Chief Complaint  Patient presents with   Anxiety    HPI Russell Ortega is a 45 year old male here today for follow-up visit.  He reports he is doing well at this time.  Feels better with reduction of Effexor.  He feels good with current dose of testosterone.  No additional concerns at this time.  ROS:  A comprehensive ROS was completed and negative except as noted per HPI  Allergies  Allergen Reactions   Lidocaine Nausea And Vomiting    Can tolerate Tetracaine   Tramadol Rash    Past Medical History:  Diagnosis Date   Anxiety    Back pain    Hypogonadism in male 06/26/2014   Sleep apnea     Past Surgical History:  Procedure Laterality Date   MICRODISCECTOMY LUMBAR      Social History   Socioeconomic History   Marital status: Married    Spouse name: Not on file   Number of children: Not on file   Years of education: Not on file   Highest education level: Not on file  Occupational History   Not on file  Tobacco Use   Smoking status: Never   Smokeless tobacco: Never  Substance and Sexual Activity   Alcohol use: Yes    Alcohol/week: 0.0 standard drinks   Drug use: No   Sexual activity: Yes    Partners: Female  Other Topics Concern   Not on file  Social History Narrative   Not on file   Social Determinants of Health   Financial Resource Strain: Not on file  Food Insecurity: Not on file  Transportation Needs: Not on file  Physical Activity: Not on file  Stress: Not on file  Social Connections: Not on file    Family History  Problem Relation Age of Onset   Breast cancer Mother    Hypertension Mother    Cancer Mother    Depression Mother    Anxiety disorder Mother    Alcoholism Mother    Obesity Mother    Hyperlipidemia Father    Hypertension Father    Diabetes Father    Obesity Father    Sleep apnea Father     Health Maintenance  Topic Date Due   Hepatitis C  Screening  Never done   COVID-19 Vaccine (3 - Booster for Moderna series) 02/26/2021 (Originally 11/24/2019)   INFLUENZA VACCINE  05/20/2021 (Originally 09/20/2020)   TETANUS/TDAP  03/20/2024   COLONOSCOPY (Pts 45-21yrs Insurance coverage will need to be confirmed)  11/09/2030   HIV Screening  Completed   Pneumococcal Vaccine 24-31 Years old  Aged Out   HPV VACCINES  Aged Out     ----------------------------------------------------------------------------------------------------------------------------------------------------------------------------------------------------------------- Physical Exam BP 129/74    Pulse 68    Ht 6\' 4"  (1.93 m)    Wt (!) 309 lb (140.2 kg)    SpO2 96%    BMI 37.61 kg/m   Physical Exam Constitutional:      Appearance: Normal appearance.  Eyes:     General: No scleral icterus. Cardiovascular:     Rate and Rhythm: Normal rate and regular rhythm.  Pulmonary:     Effort: Pulmonary effort is normal.     Breath sounds: Normal breath sounds.  Musculoskeletal:     Cervical back: Neck supple.  Neurological:     General: No focal deficit present.     Mental Status: He is alert.  Psychiatric:  Mood and Affect: Mood normal.        Behavior: Behavior normal.    ------------------------------------------------------------------------------------------------------------------------------------------------------------------------------------------------------------------- Assessment and Plan  Hypogonadism in male Acute 3 well with current dose of testosterone.  We will plan to continue and recheck levels at next visit.  Depression, major, single episode, moderate (HCC) He is doing well with current dose of Effexor.  Continue current strength.   No orders of the defined types were placed in this encounter.   Return in about 6 months (around 08/11/2021) for Annual exam/labs.    This visit occurred during the SARS-CoV-2 public health emergency.  Safety  protocols were in place, including screening questions prior to the visit, additional usage of staff PPE, and extensive cleaning of exam room while observing appropriate contact time as indicated for disinfecting solutions.

## 2021-02-10 NOTE — Assessment & Plan Note (Addendum)
He is doing well with current dose of Effexor.  Continue current strength.

## 2021-02-10 NOTE — Assessment & Plan Note (Signed)
Acute 3 well with current dose of testosterone.  We will plan to continue and recheck levels at next visit.

## 2021-02-24 DIAGNOSIS — M25521 Pain in right elbow: Secondary | ICD-10-CM | POA: Diagnosis not present

## 2021-02-24 DIAGNOSIS — M25531 Pain in right wrist: Secondary | ICD-10-CM | POA: Diagnosis not present

## 2021-03-04 DIAGNOSIS — M25521 Pain in right elbow: Secondary | ICD-10-CM | POA: Diagnosis not present

## 2021-03-04 DIAGNOSIS — M25531 Pain in right wrist: Secondary | ICD-10-CM | POA: Diagnosis not present

## 2021-03-11 DIAGNOSIS — M25521 Pain in right elbow: Secondary | ICD-10-CM | POA: Diagnosis not present

## 2021-03-11 DIAGNOSIS — M25531 Pain in right wrist: Secondary | ICD-10-CM | POA: Diagnosis not present

## 2021-03-18 DIAGNOSIS — M25521 Pain in right elbow: Secondary | ICD-10-CM | POA: Diagnosis not present

## 2021-03-18 DIAGNOSIS — M25531 Pain in right wrist: Secondary | ICD-10-CM | POA: Diagnosis not present

## 2021-03-24 DIAGNOSIS — M25531 Pain in right wrist: Secondary | ICD-10-CM | POA: Diagnosis not present

## 2021-03-24 DIAGNOSIS — M25521 Pain in right elbow: Secondary | ICD-10-CM | POA: Diagnosis not present

## 2021-03-25 ENCOUNTER — Encounter: Payer: Self-pay | Admitting: Family Medicine

## 2021-03-25 DIAGNOSIS — E291 Testicular hypofunction: Secondary | ICD-10-CM

## 2021-03-25 MED ORDER — TESTOSTERONE CYPIONATE 200 MG/ML IM SOLN: INTRAMUSCULAR | 3 refills | Status: DC

## 2021-03-25 MED ORDER — VENLAFAXINE HCL ER 75 MG PO CP24: ORAL_CAPSULE | ORAL | 3 refills | Status: DC

## 2021-03-25 NOTE — Telephone Encounter (Signed)
Updated rx signed.   CM

## 2021-04-01 DIAGNOSIS — M25531 Pain in right wrist: Secondary | ICD-10-CM | POA: Diagnosis not present

## 2021-04-01 DIAGNOSIS — M25521 Pain in right elbow: Secondary | ICD-10-CM | POA: Diagnosis not present

## 2021-04-07 DIAGNOSIS — M25521 Pain in right elbow: Secondary | ICD-10-CM | POA: Diagnosis not present

## 2021-04-07 DIAGNOSIS — M25531 Pain in right wrist: Secondary | ICD-10-CM | POA: Diagnosis not present

## 2021-04-12 DIAGNOSIS — M25531 Pain in right wrist: Secondary | ICD-10-CM | POA: Diagnosis not present

## 2021-04-12 DIAGNOSIS — M25521 Pain in right elbow: Secondary | ICD-10-CM | POA: Diagnosis not present

## 2021-04-21 DIAGNOSIS — M25521 Pain in right elbow: Secondary | ICD-10-CM | POA: Diagnosis not present

## 2021-04-21 DIAGNOSIS — M25531 Pain in right wrist: Secondary | ICD-10-CM | POA: Diagnosis not present

## 2021-04-29 DIAGNOSIS — M25521 Pain in right elbow: Secondary | ICD-10-CM | POA: Diagnosis not present

## 2021-04-29 DIAGNOSIS — M25531 Pain in right wrist: Secondary | ICD-10-CM | POA: Diagnosis not present

## 2021-05-06 DIAGNOSIS — M25521 Pain in right elbow: Secondary | ICD-10-CM | POA: Diagnosis not present

## 2021-05-06 DIAGNOSIS — M25531 Pain in right wrist: Secondary | ICD-10-CM | POA: Diagnosis not present

## 2021-05-12 DIAGNOSIS — G4733 Obstructive sleep apnea (adult) (pediatric): Secondary | ICD-10-CM | POA: Diagnosis not present

## 2021-05-13 DIAGNOSIS — M25521 Pain in right elbow: Secondary | ICD-10-CM | POA: Diagnosis not present

## 2021-05-13 DIAGNOSIS — M25531 Pain in right wrist: Secondary | ICD-10-CM | POA: Diagnosis not present

## 2021-05-16 ENCOUNTER — Other Ambulatory Visit: Payer: Self-pay | Admitting: Family Medicine

## 2021-05-20 DIAGNOSIS — M25531 Pain in right wrist: Secondary | ICD-10-CM | POA: Diagnosis not present

## 2021-05-20 DIAGNOSIS — M25521 Pain in right elbow: Secondary | ICD-10-CM | POA: Diagnosis not present

## 2021-06-03 ENCOUNTER — Other Ambulatory Visit: Payer: Self-pay

## 2021-06-03 DIAGNOSIS — E291 Testicular hypofunction: Secondary | ICD-10-CM

## 2021-06-06 ENCOUNTER — Encounter: Payer: Self-pay | Admitting: Family Medicine

## 2021-06-06 MED ORDER — TESTOSTERONE CYPIONATE 200 MG/ML IM SOLN: INTRAMUSCULAR | 3 refills | Status: DC

## 2021-06-06 MED ORDER — VENLAFAXINE HCL ER 75 MG PO CP24: ORAL_CAPSULE | ORAL | 2 refills | Status: DC

## 2021-06-29 DIAGNOSIS — G4733 Obstructive sleep apnea (adult) (pediatric): Secondary | ICD-10-CM | POA: Diagnosis not present

## 2021-06-29 DIAGNOSIS — Z6836 Body mass index (BMI) 36.0-36.9, adult: Secondary | ICD-10-CM | POA: Diagnosis not present

## 2021-06-29 DIAGNOSIS — E6609 Other obesity due to excess calories: Secondary | ICD-10-CM | POA: Diagnosis not present

## 2021-08-11 ENCOUNTER — Encounter: Payer: Self-pay | Admitting: Family Medicine

## 2021-08-11 ENCOUNTER — Ambulatory Visit (INDEPENDENT_AMBULATORY_CARE_PROVIDER_SITE_OTHER): Payer: BC Managed Care – PPO | Admitting: Family Medicine

## 2021-08-11 VITALS — BP 114/71 | HR 54 | Ht 76.0 in | Wt 293.0 lb

## 2021-08-11 DIAGNOSIS — Z0000 Encounter for general adult medical examination without abnormal findings: Secondary | ICD-10-CM | POA: Diagnosis not present

## 2021-08-11 DIAGNOSIS — E782 Mixed hyperlipidemia: Secondary | ICD-10-CM | POA: Diagnosis not present

## 2021-08-11 DIAGNOSIS — L918 Other hypertrophic disorders of the skin: Secondary | ICD-10-CM

## 2021-08-11 DIAGNOSIS — I10 Essential (primary) hypertension: Secondary | ICD-10-CM

## 2021-08-11 DIAGNOSIS — F321 Major depressive disorder, single episode, moderate: Secondary | ICD-10-CM

## 2021-08-11 DIAGNOSIS — E291 Testicular hypofunction: Secondary | ICD-10-CM

## 2021-08-11 MED ORDER — TESTOSTERONE CYPIONATE 200 MG/ML IM SOLN: INTRAMUSCULAR | 3 refills | Status: DC

## 2021-08-11 MED ORDER — VENLAFAXINE HCL ER 37.5 MG PO CP24
ORAL_CAPSULE | ORAL | 3 refills | Status: DC
Start: 2021-08-11 — End: 2021-10-16

## 2021-08-11 NOTE — Assessment & Plan Note (Signed)
Well adult Orders Placed This Encounter  Procedures  . COMPLETE METABOLIC PANEL WITH GFR  . CBC with Differential  . Lipid Panel w/reflex Direct LDL  . TSH  . PSA  . Testosterone  Screenings: Per lab orders.  Colon cancer screening is UTD Immunizations: UTD Anticipatory guidance/Risk factor reduction:  Recommendations per AVS

## 2021-08-11 NOTE — Assessment & Plan Note (Signed)
Treated with liquid nitrogen today.  Tolerated well. 

## 2021-08-11 NOTE — Assessment & Plan Note (Signed)
Remains off medication and BP is well controlled.

## 2021-08-11 NOTE — Patient Instructions (Signed)
Skin tag may become a little irritated over the next few days.  This should shrink some and eventually resolve.  Let me know if this doesn't completely go away.   Preventive Care 3-47 Years Old, Male Preventive care refers to lifestyle choices and visits with your health care provider that can promote health and wellness. Preventive care visits are also called wellness exams. What can I expect for my preventive care visit? Counseling During your preventive care visit, your health care provider may ask about your: Medical history, including: Past medical problems. Family medical history. Current health, including: Emotional well-being. Home life and relationship well-being. Sexual activity. Lifestyle, including: Alcohol, nicotine or tobacco, and drug use. Access to firearms. Diet, exercise, and sleep habits. Safety issues such as seatbelt and bike helmet use. Sunscreen use. Work and work Astronomer. Physical exam Your health care provider will check your: Height and weight. These may be used to calculate your BMI (body mass index). BMI is a measurement that tells if you are at a healthy weight. Waist circumference. This measures the distance around your waistline. This measurement also tells if you are at a healthy weight and may help predict your risk of certain diseases, such as type 2 diabetes and high blood pressure. Heart rate and blood pressure. Body temperature. Skin for abnormal spots. What immunizations do I need?  Vaccines are usually given at various ages, according to a schedule. Your health care provider will recommend vaccines for you based on your age, medical history, and lifestyle or other factors, such as travel or where you work. What tests do I need? Screening Your health care provider may recommend screening tests for certain conditions. This may include: Lipid and cholesterol levels. Diabetes screening. This is done by checking your blood sugar (glucose)  after you have not eaten for a while (fasting). Hepatitis B test. Hepatitis C test. HIV (human immunodeficiency virus) test. STI (sexually transmitted infection) testing, if you are at risk. Lung cancer screening. Prostate cancer screening. Colorectal cancer screening. Talk with your health care provider about your test results, treatment options, and if necessary, the need for more tests. Follow these instructions at home: Eating and drinking  Eat a diet that includes fresh fruits and vegetables, whole grains, lean protein, and low-fat dairy products. Take vitamin and mineral supplements as recommended by your health care provider. Do not drink alcohol if your health care provider tells you not to drink. If you drink alcohol: Limit how much you have to 0-2 drinks a day. Know how much alcohol is in your drink. In the [place] one drink equals one 12 oz bottle of beer (355 mL), one 5 oz glass of wine (148 mL), or one 1 oz glass of hard liquor (44 mL). Lifestyle Brush your teeth every morning and night with fluoride toothpaste. Floss one time each day. Exercise for at least 30 minutes 5 or more days each week. Do not use any products that contain nicotine or tobacco. These products include cigarettes, chewing tobacco, and vaping devices, such as e-cigarettes. If you need help quitting, ask your health care provider. Do not use drugs. If you are sexually active, practice safe sex. Use a condom or other form of protection to prevent STIs. Take aspirin only as told by your health care provider. Make sure that you understand how much to take and what form to take. Work with your health care provider to find out whether it is safe and beneficial for you to take aspirin daily. Find  healthy ways to manage stress, such as: Meditation, yoga, or listening to music. Journaling. Talking to a trusted person. Spending time with friends and family. Minimize exposure to UV radiation to reduce your risk of  skin cancer. Safety Always wear your seat belt while driving or riding in a vehicle. Do not drive: If you have been drinking alcohol. Do not ride with someone who has been drinking. When you are tired or distracted. While texting. If you have been using any mind-altering substances or drugs. Wear a helmet and other protective equipment during sports activities. If you have firearms in your house, make sure you follow all gun safety procedures. What's next? Go to your health care provider once a year for an annual wellness visit. Ask your health care provider how often you should have your eyes and teeth checked. Stay up to date on all vaccines. This information is not intended to replace advice given to you by your health care provider. Make sure you discuss any questions you have with your health care provider. Document Revised: 08/04/2020 Document Reviewed: 08/04/2020 Elsevier Patient Education  2023 [employer].

## 2021-08-11 NOTE — Assessment & Plan Note (Signed)
Wishes to decrease effexor to 37.5mg .

## 2021-08-11 NOTE — Progress Notes (Signed)
Russell Ortega - 46 y.o. male MRN [id number]  Date of birth: 16-Jul-1975  Subjective No chief complaint on file.   HPI Russell Ortega is a 46 y.o. male here today for annual exam.  Reports that overall he is doing well.  Tolerating current medications well.    He tries to stay pretty active.  Working on dietary changes.  Weight is down 16lbs since December.    He is a non-smoker.  Denies EtOH use.   Immunizations are UTD  Colonoscopy completed 10/2020.   Has a skin tab under left underarm that is bothering him.    Review of Systems  Constitutional:  Negative for chills, fever, malaise/fatigue and weight loss.  HENT:  Negative for congestion, ear pain and sore throat.   Eyes:  Negative for blurred vision, double vision and pain.  Respiratory:  Negative for cough and shortness of breath.   Cardiovascular:  Negative for chest pain and palpitations.  Gastrointestinal:  Negative for abdominal pain, blood in stool, constipation, heartburn and nausea.  Genitourinary:  Negative for dysuria and urgency.  Musculoskeletal:  Negative for joint pain and myalgias.  Neurological:  Negative for dizziness and headaches.  Endo/Heme/Allergies:  Does not bruise/bleed easily.  Psychiatric/Behavioral:  Negative for depression. The patient is not nervous/anxious and does not have insomnia.     Allergies  Allergen Reactions   Lidocaine Nausea And Vomiting    Can tolerate Tetracaine   Tramadol Rash    Past Medical History:  Diagnosis Date   Anxiety    Back pain    Hypogonadism in male 06/26/2014   Sleep apnea     Past Surgical History:  Procedure Laterality Date   MICRODISCECTOMY LUMBAR      Social History   Socioeconomic History   Marital status: Married    Spouse name: Not on file   Number of children: Not on file   Years of education: Not on file   Highest education level: Not on file  Occupational History   Not on file  Tobacco Use   Smoking status: Never   Smokeless  tobacco: Never  Substance and Sexual Activity   Alcohol use: Yes    Alcohol/week: 0.0 standard drinks of alcohol   Drug use: No   Sexual activity: Yes    Partners: Female  Other Topics Concern   Not on file  Social History Narrative   Not on file   Social Determinants of Health   Financial Resource Strain: Not on file  Food Insecurity: Not on file  Transportation Needs: Not on file  Physical Activity: Not on file  Stress: Not on file  Social Connections: Not on file    Family History  Problem Relation Age of Onset   Breast cancer Mother    Hypertension Mother    Cancer Mother    Depression Mother    Anxiety disorder Mother    Alcoholism Mother    Obesity Mother    Hyperlipidemia Father    Hypertension Father    Diabetes Father    Obesity Father    Sleep apnea Father     Health Maintenance  Topic Date Due   Hepatitis C Screening  Never done   COVID-19 Vaccine (3 - Moderna series) 11/24/2019   INFLUENZA VACCINE  09/20/2021   TETANUS/TDAP  03/20/2024   COLONOSCOPY (Pts 45-76yrs Insurance coverage will need to be confirmed)  11/09/2030   HIV Screening  Completed   HPV VACCINES  Aged Out     -----------------------------------------------------------------------------------------------------------------------------------------------------------------------------------------------------------------  Physical Exam BP 114/71 (BP Location: Right Arm, Patient Position: Sitting, Cuff Size: Large)   Pulse (!) 54   Ht 6\' 4"  (1.93 m)   Wt 293 lb (132.9 kg)   SpO2 97%   BMI 35.67 kg/m   Physical Exam Constitutional:      General: He is not in acute distress. HENT:     Head: Normocephalic and atraumatic.     Right Ear: Tympanic membrane and external ear normal.     Left Ear: Tympanic membrane and external ear normal.  Eyes:     General: No scleral icterus. Neck:     Thyroid: No thyromegaly.  Cardiovascular:     Rate and Rhythm: Normal rate and regular rhythm.      Heart sounds: Normal heart sounds.  Pulmonary:     Effort: Pulmonary effort is normal.     Breath sounds: Normal breath sounds.  Abdominal:     General: Bowel sounds are normal. There is no distension.     Palpations: Abdomen is soft.     Tenderness: There is no abdominal tenderness. There is no guarding.  Musculoskeletal:     Cervical back: Normal range of motion.  Lymphadenopathy:     Cervical: No cervical adenopathy.  Skin:    General: Skin is warm and dry.     Findings: No rash.     Comments: Pedunculated skin tag, left underarm. Slightly irritated.   Neurological:     Mental Status: He is alert and oriented to person, place, and time.     Cranial Nerves: No cranial nerve deficit.     Motor: No abnormal muscle tone.  Psychiatric:        Mood and Affect: Mood normal.        Behavior: Behavior normal.    Procedure:  Discussed options for skin tag removal.  Verbal consent given for cryotherapy.  Risks of procedure discussed including damage to adjacent structures, skin pigmentation changes, bleeding and infection. Skin tag in L underarm treated with liquid nitrogen, achieving a 71mm frost ring around the lesion.  This was completed for 2 cycles.  He tolerated this well. Post procedure instructions given.   ------------------------------------------------------------------------------------------------------------------------------------------------------------------------------------------------------------------- Assessment and Plan  Inflamed skin tag Treated with liquid nitrogen today.  Tolerated well.   HTN (hypertension) Remains off medication and BP is well controlled.   Depression, major, single episode, moderate (HCC) Wishes to decrease effexor to 37.5mg .    Well adult exam Well adult Orders Placed This Encounter  Procedures   COMPLETE METABOLIC PANEL WITH GFR   CBC with Differential   Lipid Panel w/reflex Direct LDL   TSH   PSA   Testosterone  Screenings:  Per lab orders.  Colon cancer screening is UTD Immunizations: UTD Anticipatory guidance/Risk factor reduction:  Recommendations per AVS     Meds ordered this encounter  Medications   venlafaxine XR (EFFEXOR-XR) 37.5 MG 24 hr capsule    Sig: TAKE 1 CAPSULE BY MOUTH DAILY WITH BREAKFAST    Dispense:  90 capsule    Refill:  3   testosterone cypionate (DEPOTESTOSTERONE CYPIONATE) 200 MG/ML injection    Sig: Inject 45ml every 10 days.    Dispense:  4 mL    Refill:  3    Return in about 6 months (around 02/10/2022) for Testosterone.    This visit occurred during the SARS-CoV-2 public health emergency.  Safety protocols were in place, including screening questions prior to the visit, additional usage of staff PPE, and extensive cleaning  of exam room while observing appropriate contact time as indicated for disinfecting solutions.

## 2021-08-12 LAB — COMPLETE METABOLIC PANEL WITH GFR
AG Ratio: 2 (calc) (ref 1.0–2.5)
ALT: 37 U/L (ref 9–46)
AST: 28 U/L (ref 10–40)
Albumin: 4.7 g/dL (ref 3.6–5.1)
Alkaline phosphatase (APISO): 71 U/L (ref 36–130)
BUN: 17 mg/dL (ref 7–25)
CO2: 26 mmol/L (ref 20–32)
Calcium: 9.4 mg/dL (ref 8.6–10.3)
Chloride: 104 mmol/L (ref 98–110)
Creat: 1.01 mg/dL (ref 0.60–1.29)
Globulin: 2.4 g/dL (calc) (ref 1.9–3.7)
Glucose, Bld: 100 mg/dL — ABNORMAL HIGH (ref 65–99)
Potassium: 4.8 mmol/L (ref 3.5–5.3)
Sodium: 139 mmol/L (ref 135–146)
Total Bilirubin: 0.6 mg/dL (ref 0.2–1.2)
Total Protein: 7.1 g/dL (ref 6.1–8.1)
eGFR: 92 mL/min/{1.73_m2} (ref >=60)

## 2021-08-12 LAB — LIPID PANEL W/REFLEX DIRECT LDL
Cholesterol: 141 mg/dL (ref <200)
HDL: 40 mg/dL (ref >=40)
LDL Cholesterol (Calc): 81 mg/dL (calc)
Non-HDL Cholesterol (Calc): 101 mg/dL (calc) (ref <130)
Total CHOL/HDL Ratio: 3.5 (calc) (ref <5.0)
Triglycerides: 102 mg/dL (ref <150)

## 2021-08-12 LAB — TESTOSTERONE: Testosterone: 980 ng/dL — ABNORMAL HIGH (ref 250–827)

## 2021-08-12 LAB — CBC WITH DIFFERENTIAL/PLATELET
Absolute Monocytes: 479 cells/uL (ref 200–950)
Basophils Absolute: 61 cells/uL (ref 0–200)
Basophils Relative: 0.8 %
Eosinophils Absolute: 122 cells/uL (ref 15–500)
Eosinophils Relative: 1.6 %
HCT: 45.9 % (ref 38.5–50.0)
Hemoglobin: 15.5 g/dL (ref 13.2–17.1)
Lymphs Abs: 2242 cells/uL (ref 850–3900)
MCH: 30 pg (ref 27.0–33.0)
MCHC: 33.8 g/dL (ref 32.0–36.0)
MCV: 88.8 fL (ref 80.0–100.0)
MPV: 11.3 fL (ref 7.5–12.5)
Monocytes Relative: 6.3 %
Neutro Abs: 4697 cells/uL (ref 1500–7800)
Neutrophils Relative %: 61.8 %
Platelets: 235 10*3/uL (ref 140–400)
RBC: 5.17 10*6/uL (ref 4.20–5.80)
RDW: 12.8 % (ref 11.0–15.0)
Total Lymphocyte: 29.5 %
WBC: 7.6 10*3/uL (ref 3.8–10.8)

## 2021-08-12 LAB — PSA: PSA: 0.48 ng/mL (ref <=4.00)

## 2021-08-12 LAB — TSH: TSH: 2 mIU/L (ref 0.40–4.50)

## 2021-08-16 DIAGNOSIS — G4733 Obstructive sleep apnea (adult) (pediatric): Secondary | ICD-10-CM | POA: Diagnosis not present

## 2021-09-28 ENCOUNTER — Encounter (INDEPENDENT_AMBULATORY_CARE_PROVIDER_SITE_OTHER): Payer: Self-pay

## 2021-10-14 ENCOUNTER — Encounter: Payer: Self-pay | Admitting: Family Medicine

## 2021-10-16 MED ORDER — VENLAFAXINE HCL ER 75 MG PO CP24: ORAL_CAPSULE | ORAL | 3 refills | Status: DC

## 2021-11-26 ENCOUNTER — Encounter: Payer: Self-pay | Admitting: Family Medicine

## 2021-11-26 DIAGNOSIS — E291 Testicular hypofunction: Secondary | ICD-10-CM

## 2021-11-28 MED ORDER — TESTOSTERONE CYPIONATE 200 MG/ML IM SOLN: INTRAMUSCULAR | 3 refills | Status: DC

## 2021-12-20 DIAGNOSIS — G4733 Obstructive sleep apnea (adult) (pediatric): Secondary | ICD-10-CM | POA: Diagnosis not present

## 2022-02-09 DIAGNOSIS — M9902 Segmental and somatic dysfunction of thoracic region: Secondary | ICD-10-CM | POA: Diagnosis not present

## 2022-02-09 DIAGNOSIS — R201 Hypoesthesia of skin: Secondary | ICD-10-CM | POA: Diagnosis not present

## 2022-02-09 DIAGNOSIS — M9901 Segmental and somatic dysfunction of cervical region: Secondary | ICD-10-CM | POA: Diagnosis not present

## 2022-02-09 DIAGNOSIS — M7711 Lateral epicondylitis, right elbow: Secondary | ICD-10-CM | POA: Diagnosis not present

## 2022-02-10 ENCOUNTER — Encounter: Payer: Self-pay | Admitting: Family Medicine

## 2022-02-10 ENCOUNTER — Ambulatory Visit: Payer: BC Managed Care – PPO | Admitting: Family Medicine

## 2022-02-10 VITALS — BP 125/69 | HR 64 | Ht 76.0 in | Wt 286.0 lb

## 2022-02-10 DIAGNOSIS — I10 Essential (primary) hypertension: Secondary | ICD-10-CM

## 2022-02-10 DIAGNOSIS — F321 Major depressive disorder, single episode, moderate: Secondary | ICD-10-CM

## 2022-02-10 DIAGNOSIS — E291 Testicular hypofunction: Secondary | ICD-10-CM

## 2022-02-10 MED ORDER — TESTOSTERONE CYPIONATE 200 MG/ML IM SOLN: INTRAMUSCULAR | 3 refills | Status: DC

## 2022-02-10 NOTE — Progress Notes (Signed)
Russell Ortega - 46 y.o. male MRN [id number]  Date of birth: Dec 02, 1975  Subjective Chief Complaint  Patient presents with   Medication Refill    HPI Russell Ortega is a 46 y.o. male here today for follow up visit.   Reports he is feeling well.  Continues on testosterone supplementation due to hypogonadism.  Currently doing 100mg  weekly.  Tolerating well.  No significant side effects at current strength.   Bp has remained well controlled off medications.  He has made changes to his diet and exercise patterns over the past year.  Weight is down an additional 7lbs since last visit.   Has been dealing with tennis elbow and some neck pain.  Seeing what sounds like massage/physical therapist for this and has noted some improvement.   Remains on Effexor.  Current strength is working well for him.    ROS:  A comprehensive ROS was completed and negative except as noted per HPI  Allergies  Allergen Reactions   Lidocaine Nausea And Vomiting    Can tolerate Tetracaine   Tramadol Rash    Past Medical History:  Diagnosis Date   Anxiety    Back pain    Hypogonadism in male 06/26/2014   Sleep apnea     Past Surgical History:  Procedure Laterality Date   MICRODISCECTOMY LUMBAR      Social History   Socioeconomic History   Marital status: Married    Spouse name: Not on file   Number of children: Not on file   Years of education: Not on file   Highest education level: Not on file  Occupational History   Not on file  Tobacco Use   Smoking status: Never   Smokeless tobacco: Never  Substance and Sexual Activity   Alcohol use: Yes    Alcohol/week: 0.0 standard drinks of alcohol   Drug use: No   Sexual activity: Yes    Partners: Female  Other Topics Concern   Not on file  Social History Narrative   Not on file   Social Determinants of Health   Financial Resource Strain: Not on file  Food Insecurity: Not on file  Transportation Needs: Not on file  Physical Activity: Not  on file  Stress: Not on file  Social Connections: Not on file    Family History  Problem Relation Age of Onset   Breast cancer Mother    Hypertension Mother    Cancer Mother    Depression Mother    Anxiety disorder Mother    Alcoholism Mother    Obesity Mother    Hyperlipidemia Father    Hypertension Father    Diabetes Father    Obesity Father    Sleep apnea Father     Health Maintenance  Topic Date Due   Hepatitis C Screening  Never done   COVID-19 Vaccine (3 - Moderna risk series) 02/26/2022 (Originally 10/27/2019)   INFLUENZA VACCINE  05/21/2022 (Originally 09/20/2021)   DTaP/Tdap/Td (3 - Td or Tdap) 03/20/2024   COLONOSCOPY (Pts 45-48yrs Insurance coverage will need to be confirmed)  11/09/2030   HIV Screening  Completed   HPV VACCINES  Aged Out     ----------------------------------------------------------------------------------------------------------------------------------------------------------------------------------------------------------------- Physical Exam BP 125/69 (BP Location: Left Arm, Patient Position: Sitting, Cuff Size: Large)   Pulse 64   Ht 6\' 4"  (1.93 m)   Wt 286 lb (129.7 kg)   SpO2 96%   BMI 34.81 kg/m   Physical Exam Constitutional:      Appearance: Normal appearance.  HENT:     Head: Normocephalic and atraumatic.  Neurological:     Mental Status: He is alert.  Psychiatric:        Mood and Affect: Mood normal.        Behavior: Behavior normal.     ------------------------------------------------------------------------------------------------------------------------------------------------------------------------------------------------------------------- Assessment and Plan  HTN (hypertension) BP remains well controlled. Congratulated and encouraged continuation of dietary and lifestyle changes.   Hypogonadism in male Updating labs today.   Orders Placed This Encounter  Procedures   Testosterone   PSA   CBC with  Differential     Depression, major, single episode, moderate (HCC) Doing well with effexor at current strength.  Recommend continuation.     Meds ordered this encounter  Medications   testosterone cypionate (DEPOTESTOSTERONE CYPIONATE) 200 MG/ML injection    Sig: Inject 11ml every 10 days.    Dispense:  4 mL    Refill:  3    No follow-ups on file.    This visit occurred during the SARS-CoV-2 public health emergency.  Safety protocols were in place, including screening questions prior to the visit, additional usage of staff PPE, and extensive cleaning of exam room while observing appropriate contact time as indicated for disinfecting solutions.

## 2022-02-10 NOTE — Assessment & Plan Note (Signed)
Updating labs today.   Orders Placed This Encounter  Procedures   Testosterone   PSA   CBC with Differential

## 2022-02-10 NOTE — Assessment & Plan Note (Signed)
Doing well with effexor at current strength.  Recommend continuation.

## 2022-02-10 NOTE — Assessment & Plan Note (Signed)
BP remains well controlled. Congratulated and encouraged continuation of dietary and lifestyle changes.

## 2022-02-10 NOTE — Patient Instructions (Signed)
Continue current medications.  Look into PRP if tendonitis is not improving.

## 2022-02-11 LAB — CBC WITH DIFFERENTIAL/PLATELET
Absolute Monocytes: 481 cells/uL (ref 200–950)
Basophils Absolute: 89 cells/uL (ref 0–200)
Basophils Relative: 1.2 %
Eosinophils Absolute: 133 cells/uL (ref 15–500)
Eosinophils Relative: 1.8 %
HCT: 45.6 % (ref 38.5–50.0)
Hemoglobin: 16.6 g/dL (ref 13.2–17.1)
Lymphs Abs: 2294 cells/uL (ref 850–3900)
MCH: 32.5 pg (ref 27.0–33.0)
MCHC: 36.4 g/dL — ABNORMAL HIGH (ref 32.0–36.0)
MCV: 89.2 fL (ref 80.0–100.0)
MPV: 10.7 fL (ref 7.5–12.5)
Monocytes Relative: 6.5 %
Neutro Abs: 4403 cells/uL (ref 1500–7800)
Neutrophils Relative %: 59.5 %
Platelets: 252 10*3/uL (ref 140–400)
RBC: 5.11 10*6/uL (ref 4.20–5.80)
RDW: 13.6 % (ref 11.0–15.0)
Total Lymphocyte: 31 %
WBC: 7.4 10*3/uL (ref 3.8–10.8)

## 2022-02-11 LAB — TESTOSTERONE: Testosterone: 66 ng/dL — ABNORMAL LOW (ref 250–827)

## 2022-02-11 LAB — PSA: PSA: 0.44 ng/mL (ref <=4.00)

## 2022-02-14 ENCOUNTER — Encounter: Payer: Self-pay | Admitting: Family Medicine

## 2022-02-27 DIAGNOSIS — M9901 Segmental and somatic dysfunction of cervical region: Secondary | ICD-10-CM | POA: Diagnosis not present

## 2022-02-27 DIAGNOSIS — R201 Hypoesthesia of skin: Secondary | ICD-10-CM | POA: Diagnosis not present

## 2022-02-27 DIAGNOSIS — M9902 Segmental and somatic dysfunction of thoracic region: Secondary | ICD-10-CM | POA: Diagnosis not present

## 2022-02-27 DIAGNOSIS — M7711 Lateral epicondylitis, right elbow: Secondary | ICD-10-CM | POA: Diagnosis not present

## 2022-03-06 DIAGNOSIS — M7711 Lateral epicondylitis, right elbow: Secondary | ICD-10-CM | POA: Diagnosis not present

## 2022-03-06 DIAGNOSIS — M9901 Segmental and somatic dysfunction of cervical region: Secondary | ICD-10-CM | POA: Diagnosis not present

## 2022-03-06 DIAGNOSIS — R201 Hypoesthesia of skin: Secondary | ICD-10-CM | POA: Diagnosis not present

## 2022-03-06 DIAGNOSIS — M9902 Segmental and somatic dysfunction of thoracic region: Secondary | ICD-10-CM | POA: Diagnosis not present

## 2022-03-16 DIAGNOSIS — R201 Hypoesthesia of skin: Secondary | ICD-10-CM | POA: Diagnosis not present

## 2022-03-16 DIAGNOSIS — M7711 Lateral epicondylitis, right elbow: Secondary | ICD-10-CM | POA: Diagnosis not present

## 2022-03-16 DIAGNOSIS — M9901 Segmental and somatic dysfunction of cervical region: Secondary | ICD-10-CM | POA: Diagnosis not present

## 2022-03-16 DIAGNOSIS — M9902 Segmental and somatic dysfunction of thoracic region: Secondary | ICD-10-CM | POA: Diagnosis not present

## 2022-03-25 ENCOUNTER — Encounter: Payer: Self-pay | Admitting: Family Medicine

## 2022-03-29 DIAGNOSIS — G4733 Obstructive sleep apnea (adult) (pediatric): Secondary | ICD-10-CM | POA: Diagnosis not present

## 2022-03-30 DIAGNOSIS — M9901 Segmental and somatic dysfunction of cervical region: Secondary | ICD-10-CM | POA: Diagnosis not present

## 2022-03-30 DIAGNOSIS — M7711 Lateral epicondylitis, right elbow: Secondary | ICD-10-CM | POA: Diagnosis not present

## 2022-03-30 DIAGNOSIS — R201 Hypoesthesia of skin: Secondary | ICD-10-CM | POA: Diagnosis not present

## 2022-03-30 DIAGNOSIS — M9902 Segmental and somatic dysfunction of thoracic region: Secondary | ICD-10-CM | POA: Diagnosis not present

## 2022-04-11 ENCOUNTER — Encounter: Payer: Self-pay | Admitting: Family Medicine

## 2022-04-11 ENCOUNTER — Ambulatory Visit: Payer: BC Managed Care – PPO | Admitting: Family Medicine

## 2022-04-11 VITALS — BP 136/85 | HR 56 | Ht 76.0 in | Wt 295.0 lb

## 2022-04-11 DIAGNOSIS — E291 Testicular hypofunction: Secondary | ICD-10-CM | POA: Diagnosis not present

## 2022-04-11 DIAGNOSIS — F321 Major depressive disorder, single episode, moderate: Secondary | ICD-10-CM | POA: Diagnosis not present

## 2022-04-11 MED ORDER — DESVENLAFAXINE SUCCINATE ER 50 MG PO TB24: 50.0000 mg | ORAL_TABLET | Freq: Every day | ORAL | 1 refills | Status: DC

## 2022-04-11 NOTE — Assessment & Plan Note (Signed)
He is having increased depression with some anxiety as well.  Will try change to Pristiq to see if this works any better for him.  Additionally we discussed dietary changes that may be helpful for increasing his serotonin.  Encouraged to reincorporate exercise as this can be helpful as well.  Magnesium supplementation may be helpful for stress and sleep as well.

## 2022-04-11 NOTE — Progress Notes (Signed)
Russell Ortega - 47 y.o. male MRN [id number]  Date of birth: 15-Feb-1976  Subjective Chief Complaint  Patient presents with   Medication Problem    HPI Russell Ortega is a 47 year old male here today for follow-up visit.  He reports he has had increased stress with depression and anxiety.  Decreased energy levels and motivation.  Reports that he "feels like he is in a fog".  Currently taking Effexor 75 mg daily.  This has been fairly effective for him up until recently.  He is interested in possibly trying something different to see if this is more effective.  Additionally, he is interested in natural means to help increase his serotonin as well.  He is not exercising as regularly as he was previously due to not feeling motivated.  Remains on testosterone therapy.  He does feel that his levels crash before his next injection every couple weeks.     04/11/2022    4:35 PM 02/10/2022    9:18 AM 08/11/2021    8:56 AM  Depression screen PHQ 2/9  Decreased Interest 1 0 0  Down, Depressed, Hopeless 2 0 0  PHQ - 2 Score 3 0 0  Altered sleeping 2 1 1  $ Tired, decreased energy 2 0 0  Change in appetite 3 0 0  Feeling bad or failure about yourself  1 0 0  Trouble concentrating 1 0 0  Moving slowly or fidgety/restless 0 0 0  Suicidal thoughts 0 0 0  PHQ-9 Score 12 1 1  $ Difficult doing work/chores Somewhat difficult Not difficult at all Not difficult at all      04/11/2022    4:35 PM 02/10/2021    8:34 AM 04/21/2020    1:29 PM 05/22/2019    3:21 PM  GAD 7 : Generalized Anxiety Score  Nervous, Anxious, on Edge 2 0 2 0  Control/stop worrying 2 0 3 0  Worry too much - different things 2 0 3 0  Trouble relaxing 2 0 2 0  Restless 0 0 2 0  Easily annoyed or irritable 2 1 3 $ 0  Afraid - awful might happen 2 0 1 0  Total GAD 7 Score 12 1 16 $ 0  Anxiety Difficulty Somewhat difficult Not difficult at all Somewhat difficult Not difficult at all      ROS:  A comprehensive ROS was completed and  negative except as noted per HPI  Allergies  Allergen Reactions   Lidocaine Nausea And Vomiting    Can tolerate Tetracaine   Tramadol Rash    Past Medical History:  Diagnosis Date   Anxiety    Back pain    Hypogonadism in male 06/26/2014   Sleep apnea     Past Surgical History:  Procedure Laterality Date   MICRODISCECTOMY LUMBAR      Social History   Socioeconomic History   Marital status: Married    Spouse name: Not on file   Number of children: Not on file   Years of education: Not on file   Highest education level: Not on file  Occupational History   Not on file  Tobacco Use   Smoking status: Never   Smokeless tobacco: Never  Substance and Sexual Activity   Alcohol use: Yes    Alcohol/week: 0.0 standard drinks of alcohol   Drug use: No   Sexual activity: Yes    Partners: Female  Other Topics Concern   Not on file  Social History Narrative   Not on file  Social Determinants of Health   Financial Resource Strain: Not on file  Food Insecurity: Not on file  Transportation Needs: Not on file  Physical Activity: Not on file  Stress: Not on file  Social Connections: Not on file    Family History  Problem Relation Age of Onset   Breast cancer Mother    Hypertension Mother    Cancer Mother    Depression Mother    Anxiety disorder Mother    Alcoholism Mother    Obesity Mother    Hyperlipidemia Father    Hypertension Father    Diabetes Father    Obesity Father    Sleep apnea Father     Health Maintenance  Topic Date Due   Hepatitis C Screening  Never done   INFLUENZA VACCINE  05/21/2022 (Originally 09/20/2021)   COVID-19 Vaccine (3 - Moderna risk series) 04/27/2023 (Originally 10/27/2019)   DTaP/Tdap/Td (3 - Td or Tdap) 03/20/2024   COLONOSCOPY (Pts 45-21yr Insurance coverage will need to be confirmed)  11/09/2030   HIV Screening  Completed   HPV VACCINES  Aged Out      ----------------------------------------------------------------------------------------------------------------------------------------------------------------------------------------------------------------- Physical Exam BP 136/85 (BP Location: Left Arm, Patient Position: Sitting, Cuff Size: Large)   Pulse (!) 56   Ht 6' 4"$  (1.93 m)   Wt 295 lb (133.8 kg)   SpO2 96%   BMI 35.91 kg/m   Physical Exam Constitutional:      Appearance: Normal appearance.  HENT:     Head: Normocephalic and atraumatic.  Eyes:     General: No scleral icterus. Neurological:     Mental Status: He is alert.  Psychiatric:        Mood and Affect: Mood normal.        Behavior: Behavior normal.     ------------------------------------------------------------------------------------------------------------------------------------------------------------------------------------------------------------------- Assessment and Plan  Depression, major, single episode, moderate ([facility] He is having increased depression with some anxiety as well.  Will try change to Pristiq to see if this works any better for him.  Additionally we discussed dietary changes that may be helpful for increasing his serotonin.  Encouraged to reincorporate exercise as this can be helpful as well.  Magnesium supplementation may be helpful for stress and sleep as well.  Hypogonadism in male Changing testosterone injections to 100 mg weekly.  Follow-up in approximately 6 weeks to recheck labs.   Meds ordered this encounter  Medications   desvenlafaxine (PRISTIQ) 50 MG 24 hr tablet    Sig: Take 1 tablet (50 mg total) by mouth daily.    Dispense:  90 tablet    Refill:  1    Return in about 6 weeks (around 05/23/2022) for f/u mood.    This visit occurred during the SARS-CoV-2 public health emergency.  Safety protocols were in place, including screening questions prior to the visit, additional usage of staff PPE, and extensive  cleaning of exam room while observing appropriate contact time as indicated for disinfecting solutions.

## 2022-04-11 NOTE — Patient Instructions (Addendum)
Change testosterone to 131m weekly.  Let's try pristiq to replace effexor.  Try adding magnseium glycinate [id number]

## 2022-04-11 NOTE — Assessment & Plan Note (Signed)
Changing testosterone injections to 100 mg weekly.  Follow-up in approximately 6 weeks to recheck labs.

## 2022-04-19 ENCOUNTER — Encounter: Payer: Self-pay | Admitting: Family Medicine

## 2022-04-19 DIAGNOSIS — E291 Testicular hypofunction: Secondary | ICD-10-CM

## 2022-04-20 ENCOUNTER — Other Ambulatory Visit: Payer: Self-pay | Admitting: Family Medicine

## 2022-04-20 DIAGNOSIS — M9902 Segmental and somatic dysfunction of thoracic region: Secondary | ICD-10-CM | POA: Diagnosis not present

## 2022-04-20 DIAGNOSIS — M7711 Lateral epicondylitis, right elbow: Secondary | ICD-10-CM | POA: Diagnosis not present

## 2022-04-20 DIAGNOSIS — R201 Hypoesthesia of skin: Secondary | ICD-10-CM | POA: Diagnosis not present

## 2022-04-20 DIAGNOSIS — E291 Testicular hypofunction: Secondary | ICD-10-CM

## 2022-04-20 DIAGNOSIS — M9901 Segmental and somatic dysfunction of cervical region: Secondary | ICD-10-CM | POA: Diagnosis not present

## 2022-04-20 MED ORDER — TESTOSTERONE CYPIONATE 200 MG/ML IM SOLN: 100.0000 mg | INTRAMUSCULAR | 3 refills | Status: DC

## 2022-04-24 ENCOUNTER — Other Ambulatory Visit: Payer: Self-pay

## 2022-04-24 DIAGNOSIS — E291 Testicular hypofunction: Secondary | ICD-10-CM

## 2022-04-24 MED ORDER — TESTOSTERONE CYPIONATE 200 MG/ML IM SOLN: 100.0000 mg | INTRAMUSCULAR | 3 refills | Status: DC

## 2022-04-24 NOTE — Telephone Encounter (Signed)
Russell Ortega sent a [employer] stating he could not fill the prescription through [facility]. He is requesting it through [facility].

## 2022-04-28 ENCOUNTER — Other Ambulatory Visit: Payer: Self-pay

## 2022-04-28 DIAGNOSIS — E291 Testicular hypofunction: Secondary | ICD-10-CM

## 2022-05-11 DIAGNOSIS — M9902 Segmental and somatic dysfunction of thoracic region: Secondary | ICD-10-CM | POA: Diagnosis not present

## 2022-05-11 DIAGNOSIS — M7711 Lateral epicondylitis, right elbow: Secondary | ICD-10-CM | POA: Diagnosis not present

## 2022-05-11 DIAGNOSIS — R201 Hypoesthesia of skin: Secondary | ICD-10-CM | POA: Diagnosis not present

## 2022-05-11 DIAGNOSIS — M9901 Segmental and somatic dysfunction of cervical region: Secondary | ICD-10-CM | POA: Diagnosis not present

## 2022-05-25 ENCOUNTER — Ambulatory Visit: Payer: BC Managed Care – PPO | Admitting: Family Medicine

## 2022-06-01 DIAGNOSIS — M9902 Segmental and somatic dysfunction of thoracic region: Secondary | ICD-10-CM | POA: Diagnosis not present

## 2022-06-01 DIAGNOSIS — M9901 Segmental and somatic dysfunction of cervical region: Secondary | ICD-10-CM | POA: Diagnosis not present

## 2022-06-01 DIAGNOSIS — R201 Hypoesthesia of skin: Secondary | ICD-10-CM | POA: Diagnosis not present

## 2022-06-01 DIAGNOSIS — M7711 Lateral epicondylitis, right elbow: Secondary | ICD-10-CM | POA: Diagnosis not present

## 2022-06-29 DIAGNOSIS — M542 Cervicalgia: Secondary | ICD-10-CM | POA: Diagnosis not present

## 2022-06-29 DIAGNOSIS — M7918 Myalgia, other site: Secondary | ICD-10-CM | POA: Diagnosis not present

## 2022-06-29 DIAGNOSIS — M9902 Segmental and somatic dysfunction of thoracic region: Secondary | ICD-10-CM | POA: Diagnosis not present

## 2022-06-29 DIAGNOSIS — M9901 Segmental and somatic dysfunction of cervical region: Secondary | ICD-10-CM | POA: Diagnosis not present

## 2022-07-04 DIAGNOSIS — Z6836 Body mass index (BMI) 36.0-36.9, adult: Secondary | ICD-10-CM | POA: Diagnosis not present

## 2022-07-04 DIAGNOSIS — G4733 Obstructive sleep apnea (adult) (pediatric): Secondary | ICD-10-CM | POA: Diagnosis not present

## 2022-07-11 DIAGNOSIS — M7918 Myalgia, other site: Secondary | ICD-10-CM | POA: Diagnosis not present

## 2022-07-11 DIAGNOSIS — M542 Cervicalgia: Secondary | ICD-10-CM | POA: Diagnosis not present

## 2022-07-11 DIAGNOSIS — M9901 Segmental and somatic dysfunction of cervical region: Secondary | ICD-10-CM | POA: Diagnosis not present

## 2022-07-11 DIAGNOSIS — M9902 Segmental and somatic dysfunction of thoracic region: Secondary | ICD-10-CM | POA: Diagnosis not present

## 2022-07-12 DIAGNOSIS — G4733 Obstructive sleep apnea (adult) (pediatric): Secondary | ICD-10-CM | POA: Diagnosis not present

## 2022-08-08 DIAGNOSIS — M9901 Segmental and somatic dysfunction of cervical region: Secondary | ICD-10-CM | POA: Diagnosis not present

## 2022-08-08 DIAGNOSIS — M7918 Myalgia, other site: Secondary | ICD-10-CM | POA: Diagnosis not present

## 2022-08-08 DIAGNOSIS — M542 Cervicalgia: Secondary | ICD-10-CM | POA: Diagnosis not present

## 2022-08-08 DIAGNOSIS — M9902 Segmental and somatic dysfunction of thoracic region: Secondary | ICD-10-CM | POA: Diagnosis not present

## 2022-08-14 ENCOUNTER — Ambulatory Visit: Payer: BC Managed Care – PPO | Admitting: Family Medicine

## 2022-08-23 ENCOUNTER — Encounter: Payer: Self-pay | Admitting: Family Medicine

## 2022-08-23 ENCOUNTER — Ambulatory Visit: Payer: BC Managed Care – PPO | Admitting: Family Medicine

## 2022-08-23 VITALS — BP 115/71 | HR 61 | Ht 76.0 in | Wt 293.0 lb

## 2022-08-23 DIAGNOSIS — I10 Essential (primary) hypertension: Secondary | ICD-10-CM | POA: Diagnosis not present

## 2022-08-23 DIAGNOSIS — F321 Major depressive disorder, single episode, moderate: Secondary | ICD-10-CM

## 2022-08-23 DIAGNOSIS — E291 Testicular hypofunction: Secondary | ICD-10-CM

## 2022-08-23 MED ORDER — TRIAMCINOLONE ACETONIDE 0.1 % EX CREA: 1.0000 | TOPICAL_CREAM | Freq: Two times a day (BID) | CUTANEOUS | 0 refills | Status: DC

## 2022-08-23 MED ORDER — VENLAFAXINE HCL ER 75 MG PO CP24: ORAL_CAPSULE | ORAL | 1 refills | Status: DC

## 2022-08-23 NOTE — Progress Notes (Signed)
Russell Ortega - 47 y.o. male MRN [id number]  Date of birth: [birth year]  Subjective Chief Complaint  Patient presents with   Hypogonadism    HPI Russell Ortega is a 47 y.o. male here today for follow up visit.   He reports that he is still feeling more irritable and that pristiq does not seem to be very effective.  He was on Effexor previously which worked pretty well for him up until about 6-8 months ago.  He would be interested in changing back to Effexor.  No issues with tolerance in the past.   Remains on testosterone.  Feels that he is doing pretty well with this.  He would like to have this rechecked today.  Last injection was about 7 days ago.   ROS:  A comprehensive ROS was completed and negative except as noted per HPI   Allergies  Allergen Reactions   Lidocaine Nausea And Vomiting    Can tolerate Tetracaine   Tramadol Rash    Past Medical History:  Diagnosis Date   Anxiety    Back pain    Hypogonadism in male 06/26/2014   Sleep apnea     Past Surgical History:  Procedure Laterality Date   MICRODISCECTOMY LUMBAR      Social History   Socioeconomic History   Marital status: Married    Spouse name: Not on file   Number of children: Not on file   Years of education: Not on file   Highest education level: Bachelor's degree (e.g., BA, AB, BS)  Occupational History   Not on file  Tobacco Use   Smoking status: Never   Smokeless tobacco: Never  Substance and Sexual Activity   Alcohol use: Yes    Alcohol/week: 0.0 standard drinks of alcohol   Drug use: No   Sexual activity: Yes    Partners: Female  Other Topics Concern   Not on file  Social History Narrative   Not on file   Social Determinants of Health   Financial Resource Strain: Low Risk  (05/21/2022)   Overall Financial Resource Strain (CARDIA)    Difficulty of Paying Living Expenses: Not hard at all  Food Insecurity: No Food Insecurity (05/21/2022)   Hunger Vital Sign    Worried About Running  Out of Food in the Last Year: Never true    Ran Out of Food in the Last Year: Never true  Transportation Needs: No Transportation Needs (05/21/2022)   PRAPARE - Administrator, Civil Service (Medical): No    Lack of Transportation (Non-Medical): No  Physical Activity: Sufficiently Active (05/21/2022)   Exercise Vital Sign    Days of Exercise per Week: 4 days    Minutes of Exercise per Session: 90 min  Stress: No Stress Concern Present (05/21/2022)   [employer] of Occupational Health - Occupational Stress Questionnaire    Feeling of Stress : Only a little  Social Connections: Moderately Integrated (05/21/2022)   Social Connection and Isolation Panel [NHANES]    Frequency of Communication with Friends and Family: Never    Frequency of Social Gatherings with Friends and Family: Never    Attends Religious Services: More than 4 times per year    Active Member of [employer] or Organizations: Yes    Attends Engineer, structural: More than 4 times per year    Marital Status: Married    Family History  Problem Relation Age of Onset   Breast cancer Mother    Hypertension Mother  Cancer Mother    Depression Mother    Anxiety disorder Mother    Alcoholism Mother    Obesity Mother    Hyperlipidemia Father    Hypertension Father    Diabetes Father    Obesity Father    Sleep apnea Father     Health Maintenance  Topic Date Due   COVID-19 Vaccine (3 - Moderna risk series) 04/27/2023 (Originally 10/27/2019)   Hepatitis C Screening  08/23/2023 (Originally 05/21/1992)   INFLUENZA VACCINE  09/21/2022   DTaP/Tdap/Td (3 - Td or Tdap) 03/20/2024   Colonoscopy  11/09/2030   HIV Screening  Completed   HPV VACCINES  Aged Out     ----------------------------------------------------------------------------------------------------------------------------------------------------------------------------------------------------------------- Physical Exam BP 115/71 (BP Location:  Left Arm, Patient Position: Sitting, Cuff Size: Large)   Pulse 61   Ht 6\' 4"  (1.93 m)   Wt 293 lb (132.9 kg)   SpO2 97%   BMI 35.67 kg/m   Physical Exam Constitutional:      Appearance: Normal appearance.  Skin:    Comments: Eczematous appearing rash on L anterior leg.  Some areas of excoriation.    Neurological:     Mental Status: He is alert.  Psychiatric:        Mood and Affect: Mood normal.        Behavior: Behavior normal.     ------------------------------------------------------------------------------------------------------------------------------------------------------------------------------------------------------------------- Assessment and Plan  HTN (hypertension) BP remains well controlled at this time.  Encouraged continued exercise and dietary changes.   Hypogonadism in male Updating testosterone, cbc and PSA.   Depression, major, single episode, moderate (HCC) Changing back to effexor.  Start 75mg  daily x30 days.  May increase to 150mg  if not seeing improvement.  F/u in 3 months.    Meds ordered this encounter  Medications   venlafaxine XR (EFFEXOR XR) 75 MG 24 hr capsule    Sig: Take 75mg  daily x30 days and then increase to 150mg .    Dispense:  180 capsule    Refill:  1   triamcinolone cream (KENALOG) 0.1 %    Sig: Apply 1 Application topically 2 (two) times daily.    Dispense:  30 g    Refill:  0    Return in about 3 months (around 11/23/2022) for F/u Mood.    This visit occurred during the SARS-CoV-2 public health emergency.  Safety protocols were in place, including screening questions prior to the visit, additional usage of staff PPE, and extensive cleaning of exam room while observing appropriate contact time as indicated for disinfecting solutions.

## 2022-08-23 NOTE — Patient Instructions (Signed)
Try triamcinolone 0.1% cream on the leg.  Let me know if this doesn't help.   Stop Pristiq and re-start Effexor.  Take 75mg  daily x30 days then increase to 150mg  daily if this remains ineffective.   See me again in 3 months to follow up on mood.  Sooner if needed.

## 2022-08-23 NOTE — Assessment & Plan Note (Signed)
Changing back to effexor.  Start 75mg  daily x30 days.  May increase to 150mg  if not seeing improvement.  F/u in 3 months.

## 2022-08-23 NOTE — Assessment & Plan Note (Signed)
BP remains well controlled at this time.  Encouraged continued exercise and dietary changes.

## 2022-08-23 NOTE — Assessment & Plan Note (Signed)
Updating testosterone, cbc and PSA.

## 2022-08-24 LAB — TESTOSTERONE: Testosterone: 260 ng/dL (ref 250–827)

## 2022-08-24 LAB — CBC WITH DIFFERENTIAL/PLATELET
Absolute Monocytes: 628 cells/uL (ref 200–950)
Basophils Absolute: 80 cells/uL (ref 0–200)
Basophils Relative: 1.1 %
Eosinophils Absolute: 110 cells/uL (ref 15–500)
Eosinophils Relative: 1.5 %
HCT: 47.6 % (ref 38.5–50.0)
Hemoglobin: 15.9 g/dL (ref 13.2–17.1)
Lymphs Abs: 2044 cells/uL (ref 850–3900)
MCH: 29.6 pg (ref 27.0–33.0)
MCHC: 33.4 g/dL (ref 32.0–36.0)
MCV: 88.5 fL (ref 80.0–100.0)
MPV: 10.9 fL (ref 7.5–12.5)
Monocytes Relative: 8.6 %
Neutro Abs: 4438 cells/uL (ref 1500–7800)
Neutrophils Relative %: 60.8 %
Platelets: 196 10*3/uL (ref 140–400)
RBC: 5.38 10*6/uL (ref 4.20–5.80)
RDW: 12.9 % (ref 11.0–15.0)
Total Lymphocyte: 28 %
WBC: 7.3 10*3/uL (ref 3.8–10.8)

## 2022-08-24 LAB — PSA: PSA: 0.62 ng/mL (ref <=4.00)

## 2022-08-25 ENCOUNTER — Encounter: Payer: Self-pay | Admitting: Family Medicine

## 2022-08-25 MED ORDER — VENLAFAXINE HCL ER 75 MG PO CP24: ORAL_CAPSULE | ORAL | 1 refills | Status: DC

## 2022-10-25 DIAGNOSIS — G4733 Obstructive sleep apnea (adult) (pediatric): Secondary | ICD-10-CM | POA: Diagnosis not present

## 2022-11-23 ENCOUNTER — Other Ambulatory Visit: Payer: Self-pay

## 2022-11-23 DIAGNOSIS — E291 Testicular hypofunction: Secondary | ICD-10-CM

## 2022-11-24 MED ORDER — TESTOSTERONE CYPIONATE 200 MG/ML IM SOLN: 100.0000 mg | INTRAMUSCULAR | 3 refills | Status: DC

## 2022-11-30 ENCOUNTER — Ambulatory Visit: Payer: BC Managed Care – PPO | Admitting: Family Medicine

## 2022-11-30 ENCOUNTER — Encounter: Payer: Self-pay | Admitting: Family Medicine

## 2022-11-30 VITALS — BP 127/74 | HR 52 | Ht 76.0 in | Wt 295.8 lb

## 2022-11-30 DIAGNOSIS — I10 Essential (primary) hypertension: Secondary | ICD-10-CM

## 2022-11-30 DIAGNOSIS — E291 Testicular hypofunction: Secondary | ICD-10-CM | POA: Diagnosis not present

## 2022-11-30 DIAGNOSIS — F321 Major depressive disorder, single episode, moderate: Secondary | ICD-10-CM

## 2022-11-30 MED ORDER — TESTOSTERONE CYPIONATE 200 MG/ML IM SOLN
100.0000 mg | INTRAMUSCULAR | 3 refills | Status: DC
Start: 2022-11-30 — End: 2023-05-31

## 2022-11-30 NOTE — Progress Notes (Signed)
Russell Ortega - 47 y.o. male MRN [id number]  Date of birth: 08/16/1975  Subjective Chief Complaint  Patient presents with   Depression    Follow up     HPI Russell Ortega is a 47 y.o. male here today for follow up visit.   Restarted on effexor at previous appt.  Reports that he is doing much better compared to last visit.  Pristiq was not effective for him.  Irrtability that he was experiencing has improved significantly.  He would like to continue this at current strength.  Continues to do well with testosterone at current strength.     ROS:  A comprehensive ROS was completed and negative except as noted per HPI  Allergies  Allergen Reactions   Lidocaine Nausea And Vomiting    Can tolerate Tetracaine   Tramadol Rash    Past Medical History:  Diagnosis Date   Anxiety    Back pain    Hypogonadism in male 06/26/2014   Sleep apnea     Past Surgical History:  Procedure Laterality Date   MICRODISCECTOMY LUMBAR      Social History   Socioeconomic History   Marital status: Married    Spouse name: Not on file   Number of children: Not on file   Years of education: Not on file   Highest education level: Bachelor's degree (e.g., BA, AB, BS)  Occupational History   Not on file  Tobacco Use   Smoking status: Never   Smokeless tobacco: Never  Substance and Sexual Activity   Alcohol use: Yes    Alcohol/week: 0.0 standard drinks of alcohol   Drug use: No   Sexual activity: Yes    Partners: Female  Other Topics Concern   Not on file  Social History Narrative   Not on file   Social Determinants of Health   Financial Resource Strain: Low Risk  (05/21/2022)   Overall Financial Resource Strain (CARDIA)    Difficulty of Paying Living Expenses: Not hard at all  Food Insecurity: No Food Insecurity (05/21/2022)   Hunger Vital Sign    Worried About Running Out of Food in the Last Year: Never true    Ran Out of Food in the Last Year: Never true  Transportation Needs: No  Transportation Needs (05/21/2022)   PRAPARE - Administrator, Civil Service (Medical): No    Lack of Transportation (Non-Medical): No  Physical Activity: Sufficiently Active (05/21/2022)   Exercise Vital Sign    Days of Exercise per Week: 4 days    Minutes of Exercise per Session: 90 min  Stress: No Stress Concern Present (05/21/2022)   [employer] of Occupational Health - Occupational Stress Questionnaire    Feeling of Stress : Only a little  Social Connections: Moderately Integrated (05/21/2022)   Social Connection and Isolation Panel [NHANES]    Frequency of Communication with Friends and Family: Never    Frequency of Social Gatherings with Friends and Family: Never    Attends Religious Services: More than 4 times per year    Active Member of [employer] or Organizations: Yes    Attends Engineer, structural: More than 4 times per year    Marital Status: Married    Family History  Problem Relation Age of Onset   Breast cancer Mother    Hypertension Mother    Cancer Mother    Depression Mother    Anxiety disorder Mother    Alcoholism Mother    Obesity Mother  Hyperlipidemia Father    Hypertension Father    Diabetes Father    Obesity Father    Sleep apnea Father     Health Maintenance  Topic Date Due   COVID-19 Vaccine (3 - Moderna risk series) 04/27/2023 (Originally 10/27/2019)   INFLUENZA VACCINE  05/21/2023 (Originally 09/21/2022)   Hepatitis C Screening  08/23/2023 (Originally 05/21/1992)   DTaP/Tdap/Td (3 - Td or Tdap) 03/20/2024   Colonoscopy  11/09/2030   HIV Screening  Completed   HPV VACCINES  Aged Out     ----------------------------------------------------------------------------------------------------------------------------------------------------------------------------------------------------------------- Physical Exam BP 127/74   Pulse (!) 52   Ht 6\' 4"  (1.93 m)   Wt 295 lb 12 oz (134.2 kg)   SpO2 96%   BMI 36.00 kg/m   Physical  Exam Constitutional:      Appearance: Normal appearance.  HENT:     Head: Normocephalic and atraumatic.  Neurological:     Mental Status: He is alert.  Psychiatric:        Mood and Affect: Mood normal.        Behavior: Behavior normal.     ------------------------------------------------------------------------------------------------------------------------------------------------------------------------------------------------------------------- Assessment and Plan  Hypogonadism in male Updating testosterone, cbc and PSA.   Depression, major, single episode, moderate (HCC) Improved with change back to effexor.  Will plan to continue this at current strength.    Meds ordered this encounter  Medications   testosterone cypionate (DEPOTESTOSTERONE CYPIONATE) 200 MG/ML injection    Sig: Inject 0.5 mLs (100 mg total) into the muscle once a week.    Dispense:  6 mL    Refill:  3    Return in about 6 months (around 05/31/2023) for testosterone, Mood/BH.    This visit occurred during the SARS-CoV-2 public health emergency.  Safety protocols were in place, including screening questions prior to the visit, additional usage of staff PPE, and extensive cleaning of exam room while observing appropriate contact time as indicated for disinfecting solutions.

## 2022-11-30 NOTE — Assessment & Plan Note (Signed)
Improved with change back to effexor.  Will plan to continue this at current strength.

## 2022-11-30 NOTE — Assessment & Plan Note (Signed)
Updating testosterone, cbc and PSA.

## 2022-12-01 LAB — TESTOSTERONE: Testosterone: 503 ng/dL (ref 264–916)

## 2022-12-01 LAB — CMP14+EGFR
ALT: 42 [IU]/L (ref 0–44)
AST: 28 [IU]/L (ref 0–40)
Albumin: 4.4 g/dL (ref 4.1–5.1)
Alkaline Phosphatase: 77 [IU]/L (ref 44–121)
BUN/Creatinine Ratio: 17 (ref 9–20)
BUN: 18 mg/dL (ref 6–24)
Bilirubin Total: <0.2 mg/dL (ref 0.0–1.2)
CO2: 24 mmol/L (ref 20–29)
Calcium: 8.9 mg/dL (ref 8.7–10.2)
Chloride: 103 mmol/L (ref 96–106)
Creatinine, Ser: 1.06 mg/dL (ref 0.76–1.27)
Globulin, Total: 2 g/dL (ref 1.5–4.5)
Glucose: 96 mg/dL (ref 70–99)
Potassium: 4.6 mmol/L (ref 3.5–5.2)
Sodium: 140 mmol/L (ref 134–144)
Total Protein: 6.4 g/dL (ref 6.0–8.5)
eGFR: 87 mL/min/{1.73_m2} (ref >59)

## 2022-12-01 LAB — CBC WITH DIFFERENTIAL/PLATELET
Basophils Absolute: 0.1 10*3/uL (ref 0.0–0.2)
Basos: 1 %
EOS (ABSOLUTE): 0.1 10*3/uL (ref 0.0–0.4)
Eos: 2 %
Hematocrit: 46.5 % (ref 37.5–51.0)
Hemoglobin: 15.2 g/dL (ref 13.0–17.7)
Immature Grans (Abs): 0.1 10*3/uL (ref 0.0–0.1)
Immature Granulocytes: 1 %
Lymphocytes Absolute: 2.1 10*3/uL (ref 0.7–3.1)
Lymphs: 29 %
MCH: 30.3 pg (ref 26.6–33.0)
MCHC: 32.7 g/dL (ref 31.5–35.7)
MCV: 93 fL (ref 79–97)
Monocytes Absolute: 0.7 10*3/uL (ref 0.1–0.9)
Monocytes: 9 %
Neutrophils Absolute: 4.1 10*3/uL (ref 1.4–7.0)
Neutrophils: 58 %
Platelets: 215 10*3/uL (ref 150–450)
RBC: 5.01 x10E6/uL (ref 4.14–5.80)
RDW: 13.3 % (ref 11.6–15.4)
WBC: 7.1 10*3/uL (ref 3.4–10.8)

## 2022-12-01 LAB — PSA: Prostate Specific Ag, Serum: 0.8 ng/mL (ref 0.0–4.0)

## 2022-12-08 ENCOUNTER — Other Ambulatory Visit: Payer: Self-pay

## 2023-01-02 DIAGNOSIS — M7918 Myalgia, other site: Secondary | ICD-10-CM | POA: Diagnosis not present

## 2023-01-02 DIAGNOSIS — M9901 Segmental and somatic dysfunction of cervical region: Secondary | ICD-10-CM | POA: Diagnosis not present

## 2023-01-02 DIAGNOSIS — M542 Cervicalgia: Secondary | ICD-10-CM | POA: Diagnosis not present

## 2023-01-02 DIAGNOSIS — M9902 Segmental and somatic dysfunction of thoracic region: Secondary | ICD-10-CM | POA: Diagnosis not present

## 2023-01-16 DIAGNOSIS — M7918 Myalgia, other site: Secondary | ICD-10-CM | POA: Diagnosis not present

## 2023-01-16 DIAGNOSIS — M9901 Segmental and somatic dysfunction of cervical region: Secondary | ICD-10-CM | POA: Diagnosis not present

## 2023-01-16 DIAGNOSIS — M9902 Segmental and somatic dysfunction of thoracic region: Secondary | ICD-10-CM | POA: Diagnosis not present

## 2023-01-16 DIAGNOSIS — M542 Cervicalgia: Secondary | ICD-10-CM | POA: Diagnosis not present

## 2023-01-25 DIAGNOSIS — M9901 Segmental and somatic dysfunction of cervical region: Secondary | ICD-10-CM | POA: Diagnosis not present

## 2023-01-25 DIAGNOSIS — M9902 Segmental and somatic dysfunction of thoracic region: Secondary | ICD-10-CM | POA: Diagnosis not present

## 2023-01-25 DIAGNOSIS — M7918 Myalgia, other site: Secondary | ICD-10-CM | POA: Diagnosis not present

## 2023-01-25 DIAGNOSIS — M542 Cervicalgia: Secondary | ICD-10-CM | POA: Diagnosis not present

## 2023-02-06 DIAGNOSIS — M542 Cervicalgia: Secondary | ICD-10-CM | POA: Diagnosis not present

## 2023-02-06 DIAGNOSIS — M9901 Segmental and somatic dysfunction of cervical region: Secondary | ICD-10-CM | POA: Diagnosis not present

## 2023-02-06 DIAGNOSIS — M7918 Myalgia, other site: Secondary | ICD-10-CM | POA: Diagnosis not present

## 2023-02-06 DIAGNOSIS — G4733 Obstructive sleep apnea (adult) (pediatric): Secondary | ICD-10-CM | POA: Diagnosis not present

## 2023-02-06 DIAGNOSIS — M9902 Segmental and somatic dysfunction of thoracic region: Secondary | ICD-10-CM | POA: Diagnosis not present

## 2023-02-20 DIAGNOSIS — M9902 Segmental and somatic dysfunction of thoracic region: Secondary | ICD-10-CM | POA: Diagnosis not present

## 2023-02-20 DIAGNOSIS — M9901 Segmental and somatic dysfunction of cervical region: Secondary | ICD-10-CM | POA: Diagnosis not present

## 2023-02-20 DIAGNOSIS — M7918 Myalgia, other site: Secondary | ICD-10-CM | POA: Diagnosis not present

## 2023-02-20 DIAGNOSIS — M542 Cervicalgia: Secondary | ICD-10-CM | POA: Diagnosis not present

## 2023-03-05 ENCOUNTER — Ambulatory Visit: Payer: Self-pay | Admitting: Family Medicine

## 2023-03-05 NOTE — Telephone Encounter (Signed)
 Copied from CRM [id number]. Topic: Clinical - Red Word Triage >> Mar 05, 2023 10:51 AM Chase BROCKS wrote: Communication [facility] that prompted transfer to Nurse Triage: Patient called in requesting to reschedule his 03/12/2023 appointment but states that he might have hernia due to removing trees from his personal property as well possibly from the Gym. Patient stated that he has a lump and sharp pain in the stomach feels of bloating and its pointing towards lower abdomen. Transferred to Nurse Triage Line.   Chief Complaint: Lower abd pain/ tenderness Symptoms: lower abd pain, bloating Frequency: Ongoing since last Friday Pertinent Negatives: Patient denies nausea, vomiting, diarrhea Disposition: [] ED /[] Urgent Care (no appt availability in office) / [x] Appointment(In office/virtual)/ []  [facility]/ [] Home Care/ [] Refused Recommended Disposition /[] [place] [facility]/ []  Follow-up with PCP  Additional Notes: Patient reported feeling discomfort and tenderness in lower abd since last Friday. It is under his belly button. He thinks he can feel something there but not sure. Patient is not certain of the cause but he stated he was recently cutting trees and he also may have overexerted himself while working out lately, and he thinks it is related. Appointed scheduled for 1/14.     Reason for Disposition  [1] MILD pain (e.g., does not interfere with normal activities) AND [2] pain comes and goes (cramps) [3] present > 48 hours  (Exception: This same abdominal pain is a chronic symptom recurrent or ongoing AND present > 4 weeks.)  Answer Assessment - Initial Assessment Questions 1. LOCATION: Where does it hurt?      Abdomen, below belly button   2. ONSET: When did the pain begin? (Minutes, hours or days ago)      Friday   3. PATTERN Does the pain come and go, or is it constant?    - If it comes and goes: How long does it last? Do you have pain now?     (Note: Comes and  goes means the pain is intermittent. It goes away completely between bouts.)    - If constant: Is it getting better, staying the same, or getting worse?      (Note: Constant means the pain never goes away completely; most serious pain is constant and gets worse.)      Comes and goes, but has been occurring everyday since Friday  4. SEVERITY: How bad is the pain?  (e.g., Scale 1-10; mild, moderate, or severe)    - MILD (1-3): Doesn't interfere with normal activities, abdomen soft and not tender to touch.     - MODERATE (4-7): Interferes with normal activities or awakens from sleep, abdomen tender to touch.     - SEVERE (8-10): Excruciating pain, doubled over, unable to do any normal activities.      Patient stated pain level is 2 or 3 out of 10. Tender to touch  5. RECURRENT SYMPTOM: Have you ever had this type of stomach pain before? If Yes, ask: When was the last time? and What happened that time?      no  6. OTHER SYMPTOMS: Do you have any other symptoms? (e.g., back pain, diarrhea, fever, urination pain, vomiting)       Bloating  Protocols used: Abdominal Pain - Male-A-AH

## 2023-03-06 ENCOUNTER — Ambulatory Visit: Payer: BC Managed Care – PPO | Admitting: Family Medicine

## 2023-03-06 ENCOUNTER — Encounter: Payer: Self-pay | Admitting: Family Medicine

## 2023-03-06 VITALS — BP 112/70 | HR 51 | Ht 76.0 in | Wt 288.2 lb

## 2023-03-06 DIAGNOSIS — R1033 Periumbilical pain: Secondary | ICD-10-CM | POA: Insufficient documentation

## 2023-03-06 NOTE — Patient Instructions (Signed)
 If you have fever again, pain that moves to the lower right side of the abdomen or worsening pain please let me know.

## 2023-03-06 NOTE — Progress Notes (Signed)
 Russell Ortega - 48 y.o. male MRN [id number]  Date of birth: 1975-12-06  Subjective Chief Complaint  Patient presents with   periumbilical pain     Pain just under belly button  x 3 days -when pressed moves pain upward. Pain scale 2 to 3 on average.     HPI Russell Ortega is a 48 y.o. male here today with complaint of l lower periumbilical pain that radiates into the lower abdomen.  First noticed this a few days ago.  Initially thought he may have pulled a muscle.  He did have low-grade fever over the weekend.  Denies nausea or vomiting.  Appetite has been okay.  Bowels are moving normally.  He is up-to-date on colon cancer screening.  Fever has resolved at this point.  ROS:  A comprehensive ROS was completed and negative except as noted per HPI  Allergies  Allergen Reactions   Lidocaine Nausea And Vomiting    Can tolerate Tetracaine   Tramadol Rash    Past Medical History:  Diagnosis Date   Anxiety    Back pain    Hypogonadism in male 06/26/2014   Sleep apnea     Past Surgical History:  Procedure Laterality Date   MICRODISCECTOMY LUMBAR      Social History   Socioeconomic History   Marital status: Married    Spouse name: Not on file   Number of children: Not on file   Years of education: Not on file   Highest education level: Bachelor's degree (e.g., BA, AB, BS)  Occupational History   Not on file  Tobacco Use   Smoking status: Never   Smokeless tobacco: Never  Substance and Sexual Activity   Alcohol use: Yes    Alcohol/week: 0.0 standard drinks of alcohol   Drug use: No   Sexual activity: Yes    Partners: Female  Other Topics Concern   Not on file  Social History Narrative   Not on file   Social Drivers of Health   Financial Resource Strain: Low Risk  (03/05/2023)   Overall Financial Resource Strain (CARDIA)    Difficulty of Paying Living Expenses: Not hard at all  Food Insecurity: No Food Insecurity (03/05/2023)   Hunger Vital Sign    Worried About  Running Out of Food in the Last Year: Never true    Ran Out of Food in the Last Year: Never true  Transportation Needs: No Transportation Needs (03/05/2023)   PRAPARE - Administrator, Civil Service (Medical): No    Lack of Transportation (Non-Medical): No  Physical Activity: Sufficiently Active (03/05/2023)   Exercise Vital Sign    Days of Exercise per Week: 4 days    Minutes of Exercise per Session: 90 min  Stress: Stress Concern Present (03/05/2023)   [employer] of Occupational Health - Occupational Stress Questionnaire    Feeling of Stress : To some extent  Social Connections: Moderately Integrated (03/05/2023)   Social Connection and Isolation Panel [NHANES]    Frequency of Communication with Friends and Family: Once a week    Frequency of Social Gatherings with Friends and Family: Once a week    Attends Religious Services: More than 4 times per year    Active Member of [employer] or Organizations: Yes    Attends Engineer, Structural: More than 4 times per year    Marital Status: Married    Family History  Problem Relation Age of Onset   Breast cancer Mother  Hypertension Mother    Cancer Mother    Depression Mother    Anxiety disorder Mother    Alcoholism Mother    Obesity Mother    Hyperlipidemia Father    Hypertension Father    Diabetes Father    Obesity Father    Sleep apnea Father     Health Maintenance  Topic Date Due   COVID-19 Vaccine (3 - Moderna risk series) 04/27/2023 (Originally 10/27/2019)   INFLUENZA VACCINE  05/21/2023 (Originally 09/21/2022)   Hepatitis C Screening  08/23/2023 (Originally 05/21/1992)   DTaP/Tdap/Td (3 - Td or Tdap) 03/20/2024   Colonoscopy  11/09/2030   HIV Screening  Completed   HPV VACCINES  Aged Out      ----------------------------------------------------------------------------------------------------------------------------------------------------------------------------------------------------------------- Physical Exam BP 112/70 (BP Location: Left Arm, Patient Position: Sitting, Cuff Size: Normal)   Pulse (!) 51   Ht 6' 4 (1.93 m)   Wt 288 lb 4 oz (130.7 kg)   SpO2 98%   BMI 35.09 kg/m   Physical Exam Constitutional:      Appearance: Normal appearance.  HENT:     Head: Normocephalic and atraumatic.  Cardiovascular:     Rate and Rhythm: Normal rate and regular rhythm.  Pulmonary:     Effort: Pulmonary effort is normal.     Breath sounds: Normal breath sounds.  Abdominal:     General: There is no distension.     Palpations: Abdomen is soft.     Tenderness: There is abdominal tenderness (Tenderness to palpation along the lower periumbilical area and lower abdomen.  No right lower quadrant tenderness).  Neurological:     Mental Status: He is alert.     ------------------------------------------------------------------------------------------------------------------------------------------------------------------------------------------------------------------- Assessment and Plan  Periumbilical pain Possible viral etiology versus muscle strain versus other intra-abdominal pathology.  Less suspicious of appendicitis or diverticulitis at this time but we discussed red flags.  I will check CBC and CMP as well as lipase.   No orders of the defined types were placed in this encounter.   No follow-ups on file.    This visit occurred during the SARS-CoV-2 public health emergency.  Safety protocols were in place, including screening questions prior to the visit, additional usage of staff PPE, and extensive cleaning of exam room while observing appropriate contact time as indicated for disinfecting solutions.

## 2023-03-06 NOTE — Assessment & Plan Note (Signed)
 Possible viral etiology versus muscle strain versus other intra-abdominal pathology.  Less suspicious of appendicitis or diverticulitis at this time but we discussed red flags.  I will check CBC and CMP as well as lipase.

## 2023-03-07 LAB — CBC WITH DIFFERENTIAL/PLATELET
Basophils Absolute: 0.1 10*3/uL (ref 0.0–0.2)
Basos: 1 %
EOS (ABSOLUTE): 0.1 10*3/uL (ref 0.0–0.4)
Eos: 2 %
Hematocrit: 45.1 % (ref 37.5–51.0)
Hemoglobin: 15.2 g/dL (ref 13.0–17.7)
Immature Grans (Abs): 0.1 10*3/uL (ref 0.0–0.1)
Immature Granulocytes: 1 %
Lymphocytes Absolute: 1.9 10*3/uL (ref 0.7–3.1)
Lymphs: 27 %
MCH: 30.3 pg (ref 26.6–33.0)
MCHC: 33.7 g/dL (ref 31.5–35.7)
MCV: 90 fL (ref 79–97)
Monocytes Absolute: 0.5 10*3/uL (ref 0.1–0.9)
Monocytes: 7 %
Neutrophils Absolute: 4.4 10*3/uL (ref 1.4–7.0)
Neutrophils: 62 %
Platelets: 277 10*3/uL (ref 150–450)
RBC: 5.01 x10E6/uL (ref 4.14–5.80)
RDW: 12.7 % (ref 11.6–15.4)
WBC: 7 10*3/uL (ref 3.4–10.8)

## 2023-03-07 LAB — CMP14+EGFR
ALT: 56 [IU]/L — ABNORMAL HIGH (ref 0–44)
AST: 30 [IU]/L (ref 0–40)
Albumin: 4.6 g/dL (ref 4.1–5.1)
Alkaline Phosphatase: 114 [IU]/L (ref 44–121)
BUN/Creatinine Ratio: 20 (ref 9–20)
BUN: 18 mg/dL (ref 6–24)
Bilirubin Total: 0.3 mg/dL (ref 0.0–1.2)
CO2: 24 mmol/L (ref 20–29)
Calcium: 9.5 mg/dL (ref 8.7–10.2)
Chloride: 103 mmol/L (ref 96–106)
Creatinine, Ser: 0.92 mg/dL (ref 0.76–1.27)
Globulin, Total: 2.5 g/dL (ref 1.5–4.5)
Glucose: 89 mg/dL (ref 70–99)
Potassium: 5.4 mmol/L — ABNORMAL HIGH (ref 3.5–5.2)
Sodium: 140 mmol/L (ref 134–144)
Total Protein: 7.1 g/dL (ref 6.0–8.5)
eGFR: 103 mL/min/{1.73_m2} (ref >59)

## 2023-03-07 LAB — LIPASE: Lipase: 47 U/L (ref 13–78)

## 2023-03-09 ENCOUNTER — Encounter: Payer: Self-pay | Admitting: Family Medicine

## 2023-03-12 ENCOUNTER — Ambulatory Visit: Payer: BC Managed Care – PPO | Admitting: Family Medicine

## 2023-03-13 DIAGNOSIS — M9902 Segmental and somatic dysfunction of thoracic region: Secondary | ICD-10-CM | POA: Diagnosis not present

## 2023-03-13 DIAGNOSIS — M7918 Myalgia, other site: Secondary | ICD-10-CM | POA: Diagnosis not present

## 2023-03-13 DIAGNOSIS — M9901 Segmental and somatic dysfunction of cervical region: Secondary | ICD-10-CM | POA: Diagnosis not present

## 2023-03-13 DIAGNOSIS — M542 Cervicalgia: Secondary | ICD-10-CM | POA: Diagnosis not present

## 2023-04-02 DIAGNOSIS — M9901 Segmental and somatic dysfunction of cervical region: Secondary | ICD-10-CM | POA: Diagnosis not present

## 2023-04-02 DIAGNOSIS — M542 Cervicalgia: Secondary | ICD-10-CM | POA: Diagnosis not present

## 2023-04-02 DIAGNOSIS — M7918 Myalgia, other site: Secondary | ICD-10-CM | POA: Diagnosis not present

## 2023-04-02 DIAGNOSIS — M9902 Segmental and somatic dysfunction of thoracic region: Secondary | ICD-10-CM | POA: Diagnosis not present

## 2023-04-12 DIAGNOSIS — G4733 Obstructive sleep apnea (adult) (pediatric): Secondary | ICD-10-CM | POA: Diagnosis not present

## 2023-04-23 DIAGNOSIS — M9902 Segmental and somatic dysfunction of thoracic region: Secondary | ICD-10-CM | POA: Diagnosis not present

## 2023-04-23 DIAGNOSIS — M9901 Segmental and somatic dysfunction of cervical region: Secondary | ICD-10-CM | POA: Diagnosis not present

## 2023-04-23 DIAGNOSIS — M542 Cervicalgia: Secondary | ICD-10-CM | POA: Diagnosis not present

## 2023-04-23 DIAGNOSIS — M7918 Myalgia, other site: Secondary | ICD-10-CM | POA: Diagnosis not present

## 2023-05-28 DIAGNOSIS — M9902 Segmental and somatic dysfunction of thoracic region: Secondary | ICD-10-CM | POA: Diagnosis not present

## 2023-05-28 DIAGNOSIS — M542 Cervicalgia: Secondary | ICD-10-CM | POA: Diagnosis not present

## 2023-05-28 DIAGNOSIS — M7918 Myalgia, other site: Secondary | ICD-10-CM | POA: Diagnosis not present

## 2023-05-28 DIAGNOSIS — M9901 Segmental and somatic dysfunction of cervical region: Secondary | ICD-10-CM | POA: Diagnosis not present

## 2023-05-31 ENCOUNTER — Ambulatory Visit: Payer: BC Managed Care – PPO | Admitting: Family Medicine

## 2023-05-31 ENCOUNTER — Encounter: Payer: Self-pay | Admitting: Family Medicine

## 2023-05-31 VITALS — BP 113/71 | HR 47 | Ht 76.0 in | Wt 292.0 lb

## 2023-05-31 DIAGNOSIS — E291 Testicular hypofunction: Secondary | ICD-10-CM | POA: Diagnosis not present

## 2023-05-31 DIAGNOSIS — I10 Essential (primary) hypertension: Secondary | ICD-10-CM

## 2023-05-31 DIAGNOSIS — F321 Major depressive disorder, single episode, moderate: Secondary | ICD-10-CM

## 2023-05-31 DIAGNOSIS — E782 Mixed hyperlipidemia: Secondary | ICD-10-CM | POA: Diagnosis not present

## 2023-05-31 MED ORDER — VENLAFAXINE HCL ER 75 MG PO CP24: ORAL_CAPSULE | ORAL | 1 refills | Status: DC

## 2023-05-31 MED ORDER — TESTOSTERONE CYPIONATE 200 MG/ML IM SOLN
100.0000 mg | INTRAMUSCULAR | 3 refills | Status: DC
Start: 2023-05-31 — End: 2023-09-07

## 2023-05-31 NOTE — Progress Notes (Signed)
 Russell Ortega - 48 y.o. male MRN [id number]  Date of birth: 07/04/1975  Subjective No chief complaint on file.   HPI Russell Ortega is a 48 y.o. male here today for follow up visit.   Reports that he is doing well.   Feels good at current strength of testosterone.  No noticeable side effects at this time. He is exercising regularly and feels that strength and recovery are good.   Continues to do well with effexor at current strength.  Denies side effects at this time.  ROS:  A comprehensive ROS was completed and negative except as noted per HPI  Allergies  Allergen Reactions   Lidocaine Nausea And Vomiting    Can tolerate Tetracaine   Tramadol Rash    Past Medical History:  Diagnosis Date   Anxiety    Back pain    Hypogonadism in male 06/26/2014   Sleep apnea     Past Surgical History:  Procedure Laterality Date   MICRODISCECTOMY LUMBAR      Social History   Socioeconomic History   Marital status: Married    Spouse name: Not on file   Number of children: Not on file   Years of education: Not on file   Highest education level: Bachelor's degree (e.g., BA, AB, BS)  Occupational History   Not on file  Tobacco Use   Smoking status: Never   Smokeless tobacco: Never  Substance and Sexual Activity   Alcohol use: Yes    Alcohol/week: 0.0 standard drinks of alcohol   Drug use: No   Sexual activity: Yes    Partners: Female  Other Topics Concern   Not on file  Social History Narrative   Not on file   Social Drivers of Health   Financial Resource Strain: Low Risk  (03/05/2023)   Overall Financial Resource Strain (CARDIA)    Difficulty of Paying Living Expenses: Not hard at all  Food Insecurity: No Food Insecurity (03/05/2023)   Hunger Vital Sign    Worried About Running Out of Food in the Last Year: Never true    Ran Out of Food in the Last Year: Never true  Transportation Needs: No Transportation Needs (03/05/2023)   PRAPARE - Scientist, research (physical sciences) (Medical): No    Lack of Transportation (Non-Medical): No  Physical Activity: Sufficiently Active (03/05/2023)   Exercise Vital Sign    Days of Exercise per Week: 4 days    Minutes of Exercise per Session: 90 min  Stress: Stress Concern Present (03/05/2023)   [employer] of Occupational Health - Occupational Stress Questionnaire    Feeling of Stress : To some extent  Social Connections: Moderately Integrated (03/05/2023)   Social Connection and Isolation Panel [NHANES]    Frequency of Communication with Friends and Family: Once a week    Frequency of Social Gatherings with Friends and Family: Once a week    Attends Religious Services: More than 4 times per year    Active Member of [employer] or Organizations: Yes    Attends Engineer, structural: More than 4 times per year    Marital Status: Married    Family History  Problem Relation Age of Onset   Breast cancer Mother    Hypertension Mother    Cancer Mother    Depression Mother    Anxiety disorder Mother    Alcoholism Mother    Obesity Mother    Hyperlipidemia Father    Hypertension Father  Diabetes Father    Obesity Father    Sleep apnea Father     Health Maintenance  Topic Date Due   COVID-19 Vaccine (3 - Moderna risk series) 10/27/2019   Hepatitis C Screening  08/23/2023 (Originally 05/21/1992)   INFLUENZA VACCINE  09/21/2023   DTaP/Tdap/Td (3 - Td or Tdap) 03/20/2024   Colonoscopy  11/09/2030   HIV Screening  Completed   HPV VACCINES  Aged Out   Meningococcal B Vaccine  Aged Out     ----------------------------------------------------------------------------------------------------------------------------------------------------------------------------------------------------------------- Physical Exam There were no vitals taken for this visit.  Physical Exam Constitutional:      Appearance: Normal appearance.  Neurological:     Mental Status: He is alert.  Psychiatric:        Mood  and Affect: Mood normal.        Behavior: Behavior normal.     ------------------------------------------------------------------------------------------------------------------------------------------------------------------------------------------------------------------- Assessment and Plan  Hypogonadism in male Doing well with injectable testosterone.  Will plan to continue this at current strength.   HTN (hypertension) BP remains well controlled at this time.  Encouraged continued exercise and dietary changes.   Depression, major, single episode, moderate (HCC) Improved with change back to effexor.  Will plan to continue this at current strength.    No orders of the defined types were placed in this encounter.   No follow-ups on file.

## 2023-05-31 NOTE — Assessment & Plan Note (Signed)
BP remains well controlled at this time.  Encouraged continued exercise and dietary changes.

## 2023-05-31 NOTE — Assessment & Plan Note (Signed)
Improved with change back to effexor.  Will plan to continue this at current strength.

## 2023-05-31 NOTE — Assessment & Plan Note (Signed)
 Doing well with injectable testosterone.  Will plan to continue this at current strength.

## 2023-06-01 LAB — CBC WITH DIFFERENTIAL/PLATELET
Basophils Absolute: 0.1 10*3/uL (ref 0.0–0.2)
Basos: 1 %
EOS (ABSOLUTE): 0.2 10*3/uL (ref 0.0–0.4)
Eos: 2 %
Hematocrit: 46.8 % (ref 37.5–51.0)
Hemoglobin: 15.5 g/dL (ref 13.0–17.7)
Immature Grans (Abs): 0.1 10*3/uL (ref 0.0–0.1)
Immature Granulocytes: 1 %
Lymphocytes Absolute: 2.3 10*3/uL (ref 0.7–3.1)
Lymphs: 31 %
MCH: 30.3 pg (ref 26.6–33.0)
MCHC: 33.1 g/dL (ref 31.5–35.7)
MCV: 91 fL (ref 79–97)
Monocytes Absolute: 0.6 10*3/uL (ref 0.1–0.9)
Monocytes: 8 %
Neutrophils Absolute: 4.3 10*3/uL (ref 1.4–7.0)
Neutrophils: 57 %
Platelets: 228 10*3/uL (ref 150–450)
RBC: 5.12 x10E6/uL (ref 4.14–5.80)
RDW: 13.1 % (ref 11.6–15.4)
WBC: 7.5 10*3/uL (ref 3.4–10.8)

## 2023-06-01 LAB — CMP14+EGFR
ALT: 44 IU/L (ref 0–44)
AST: 27 IU/L (ref 0–40)
Albumin: 4.7 g/dL (ref 4.1–5.1)
Alkaline Phosphatase: 85 IU/L (ref 44–121)
BUN/Creatinine Ratio: 18 (ref 9–20)
BUN: 19 mg/dL (ref 6–24)
Bilirubin Total: 0.2 mg/dL (ref 0.0–1.2)
CO2: 23 mmol/L (ref 20–29)
Calcium: 9.4 mg/dL (ref 8.7–10.2)
Chloride: 101 mmol/L (ref 96–106)
Creatinine, Ser: 1.06 mg/dL (ref 0.76–1.27)
Globulin, Total: 1.9 g/dL (ref 1.5–4.5)
Glucose: 98 mg/dL (ref 70–99)
Potassium: 5.3 mmol/L — ABNORMAL HIGH (ref 3.5–5.2)
Sodium: 140 mmol/L (ref 134–144)
Total Protein: 6.6 g/dL (ref 6.0–8.5)
eGFR: 86 mL/min/{1.73_m2} (ref >59)

## 2023-06-01 LAB — LIPID PANEL WITH LDL/HDL RATIO
Cholesterol, Total: 152 mg/dL (ref 100–199)
HDL: 35 mg/dL — ABNORMAL LOW (ref >39)
LDL Chol Calc (NIH): 89 mg/dL (ref 0–99)
LDL/HDL Ratio: 2.5 ratio (ref 0.0–3.6)
Triglycerides: 163 mg/dL — ABNORMAL HIGH (ref 0–149)
VLDL Cholesterol Cal: 28 mg/dL (ref 5–40)

## 2023-06-01 LAB — PSA: Prostate Specific Ag, Serum: 0.7 ng/mL (ref 0.0–4.0)

## 2023-06-01 LAB — TESTOSTERONE: Testosterone: 231 ng/dL — ABNORMAL LOW (ref 264–916)

## 2023-06-04 ENCOUNTER — Encounter: Payer: Self-pay | Admitting: Family Medicine

## 2023-06-14 DIAGNOSIS — Z713 Dietary counseling and surveillance: Secondary | ICD-10-CM | POA: Diagnosis not present

## 2023-06-25 DIAGNOSIS — M542 Cervicalgia: Secondary | ICD-10-CM | POA: Diagnosis not present

## 2023-06-25 DIAGNOSIS — M9901 Segmental and somatic dysfunction of cervical region: Secondary | ICD-10-CM | POA: Diagnosis not present

## 2023-06-25 DIAGNOSIS — M9902 Segmental and somatic dysfunction of thoracic region: Secondary | ICD-10-CM | POA: Diagnosis not present

## 2023-06-25 DIAGNOSIS — M7918 Myalgia, other site: Secondary | ICD-10-CM | POA: Diagnosis not present

## 2023-07-11 DIAGNOSIS — G4733 Obstructive sleep apnea (adult) (pediatric): Secondary | ICD-10-CM | POA: Diagnosis not present

## 2023-07-11 DIAGNOSIS — Z6835 Body mass index (BMI) 35.0-35.9, adult: Secondary | ICD-10-CM | POA: Diagnosis not present

## 2023-07-26 DIAGNOSIS — M542 Cervicalgia: Secondary | ICD-10-CM | POA: Diagnosis not present

## 2023-07-26 DIAGNOSIS — M9902 Segmental and somatic dysfunction of thoracic region: Secondary | ICD-10-CM | POA: Diagnosis not present

## 2023-07-26 DIAGNOSIS — M9901 Segmental and somatic dysfunction of cervical region: Secondary | ICD-10-CM | POA: Diagnosis not present

## 2023-07-26 DIAGNOSIS — M7918 Myalgia, other site: Secondary | ICD-10-CM | POA: Diagnosis not present

## 2023-07-29 DIAGNOSIS — G4733 Obstructive sleep apnea (adult) (pediatric): Secondary | ICD-10-CM | POA: Diagnosis not present

## 2023-08-15 DIAGNOSIS — M9901 Segmental and somatic dysfunction of cervical region: Secondary | ICD-10-CM | POA: Diagnosis not present

## 2023-08-15 DIAGNOSIS — M9906 Segmental and somatic dysfunction of lower extremity: Secondary | ICD-10-CM | POA: Diagnosis not present

## 2023-08-15 DIAGNOSIS — M5136 Other intervertebral disc degeneration, lumbar region with discogenic back pain only: Secondary | ICD-10-CM | POA: Diagnosis not present

## 2023-08-15 DIAGNOSIS — M9903 Segmental and somatic dysfunction of lumbar region: Secondary | ICD-10-CM | POA: Diagnosis not present

## 2023-08-31 ENCOUNTER — Ambulatory Visit
Admission: RE | Admit: 2023-08-31 | Discharge: 2023-08-31 | Disposition: A | Discharge status: Home / Self Care | Source: Ambulatory Visit | Attending: Family Medicine | Admitting: Family Medicine

## 2023-08-31 ENCOUNTER — Encounter: Payer: Self-pay | Admitting: Family Medicine

## 2023-08-31 VITALS — BP 114/77 | HR 53 | Temp 99.0°F | Resp 19

## 2023-08-31 DIAGNOSIS — H538 Other visual disturbances: Secondary | ICD-10-CM

## 2023-08-31 DIAGNOSIS — R5383 Other fatigue: Secondary | ICD-10-CM | POA: Diagnosis not present

## 2023-08-31 LAB — POCT FASTING CBG KUC MANUAL ENTRY: POCT Glucose (KUC): 103 mg/dL — AB (ref 70–99)

## 2023-08-31 NOTE — Discharge Instructions (Addendum)
 Advised patient to follow-up with PCP regarding fatigue and need for blood labs including TSH free and total testosterone .  Encouraged patient to follow-up with ophthalmologist/optometrist regarding blurred vision.

## 2023-08-31 NOTE — ED Provider Notes (Signed)
 Russell Ortega    CSN: [id number] Arrival date & time: 08/31/23  1328      History   Chief Complaint Chief Complaint  Patient presents with   Fatigue   Blurred Vision    HPI Russell Ortega is a 48 y.o. male.   HPI 48 year old male presents with fatigue for the past month and blurry vision.  PMH significant for morbid/severe obesity, sleep apnea, and hypogonadism in male.  Patient is followed by pulmonology for sleep apnea with last visit of 07/11/2023.  Past Medical History:  Diagnosis Date   Anxiety    Back pain    Hypogonadism in male 06/26/2014   Sleep apnea     Patient Active Problem List   Diagnosis Date Noted   Periumbilical pain 03/06/2023   Inflamed skin tag 08/11/2021   Rectal bleeding 07/05/2020   Well adult exam 05/22/2019   Morbid obesity (HCC) 07/04/2017   HLD (hyperlipidemia) 06/29/2017   HTN (hypertension) 01/04/2017   Cervical radiculopathy at C6 11/23/2015   Lumbar radiculopathy 07/08/2014   Depression, major, single episode, moderate (HCC) 06/26/2014   Hypogonadism in male 06/26/2014   OSA on CPAP 06/26/2014    Past Surgical History:  Procedure Laterality Date   MICRODISCECTOMY LUMBAR         Home Medications    Prior to Admission medications   Medication Sig Start Date End Date Taking? Authorizing Provider  BD HYPODERMIC NEEDLE 18G X 1 MISC USE TO DRAW UP TESTOSTERONE  10/01/18   Corey, Evan S, MD  Multiple Vitamin (MULTIVITAMIN WITH MINERALS) TABS tablet Take 8 tablets by mouth daily.    [provider]  Syringe, Disposable, 1 ML MISC Use to administer testosterone . 07/04/17   Corey, Evan S, MD  SYRINGE-NEEDLE, DISP, 3 ML (B-D INTEGRA SYRINGE) 22G X 1-1/2 3 ML MISC USE TO ADMINISTER TESTOSTERONE  AS DIRECTED 02/17/19   Curtis Debby PARAS, MD  testosterone  cypionate (DEPOTESTOSTERONE CYPIONATE) 200 MG/ML injection Inject 0.5 mLs (100 mg total) into the muscle once a week. 05/31/23   Alvia Bring, DO  venlafaxine  XR  (EFFEXOR  XR) 75 MG 24 hr capsule Take 75mg  daily x30 days and then increase to 150mg . 05/31/23   Alvia Bring, DO    Family History Family History  Problem Relation Age of Onset   Breast cancer Mother    Hypertension Mother    Cancer Mother    Depression Mother    Anxiety disorder Mother    Alcoholism Mother    Obesity Mother    Hyperlipidemia Father    Hypertension Father    Diabetes Father    Obesity Father    Sleep apnea Father     Social History Social History   Tobacco Use   Smoking status: Never   Smokeless tobacco: Never  Substance Use Topics   Alcohol use: Yes    Alcohol/week: 0.0 standard drinks of alcohol   Drug use: No     Allergies   Lidocaine and Tramadol   Review of Systems Review of Systems  Constitutional:  Positive for fatigue.  Eyes:        Intermittent blurred vision  All other systems reviewed and are negative.    Physical Exam Triage Vital Signs ED Triage Vitals  Encounter Vitals Group     BP      Girls Systolic BP Percentile      Girls Diastolic BP Percentile      Boys Systolic BP Percentile      Boys Diastolic BP Percentile  Pulse      Resp      Temp      Temp src      SpO2      Weight      Height      Head Circumference      Peak Flow      Pain Score      Pain Loc      Pain Education      Exclude from Growth Chart    No data found.  Updated Vital Signs BP 114/77   Pulse (!) 53   Temp 99 F (37.2 C)   Resp 19   SpO2 98%    Physical Exam Vitals and nursing note reviewed.  Constitutional:      General: He is not in acute distress.    Appearance: Normal appearance. He is normal weight. He is not ill-appearing.  HENT:     Head: Normocephalic and atraumatic.     Mouth/Throat:     Mouth: Mucous membranes are moist.     Pharynx: Oropharynx is clear.  Eyes:     Extraocular Movements: Extraocular movements intact.     Conjunctiva/sclera: Conjunctivae normal.     Pupils: Pupils are equal, round, and  reactive to light.  Cardiovascular:     Rate and Rhythm: Normal rate and regular rhythm.     Pulses: Normal pulses.     Heart sounds: Normal heart sounds.  Pulmonary:     Effort: Pulmonary effort is normal.     Breath sounds: Normal breath sounds. No wheezing, rhonchi or rales.  Musculoskeletal:        General: Normal range of motion.     Cervical back: Normal range of motion and neck supple.  Skin:    General: Skin is warm and dry.  Neurological:     General: No focal deficit present.     Mental Status: He is alert and oriented to person, place, and time. Mental status is at baseline.  Psychiatric:        Mood and Affect: Mood normal.        Behavior: Behavior normal.      UC Treatments / Results  Labs (all labs ordered are listed, but only abnormal results are displayed) Labs Reviewed  POCT FASTING CBG [facility] MANUAL ENTRY - Abnormal; Notable for the following components:      Result Value   POCT Glucose ([facility]) 103 (*)    All other components within normal limits    EKG   Radiology No results found.  Procedures Procedures (including critical Ortega time)  Medications Ordered in UC Medications - No data to display  Initial Impression / Assessment and Plan / UC Course  I have reviewed the triage vital signs and the nursing notes.  Pertinent labs & imaging results that were available during my Ortega of the patient were reviewed by me and considered in my medical decision making (see chart for details).     MDM: 1.  Fatigue, unspecified type-patient has history of low testosterone /hypogonadism we will with total testosterone  231 on 05/31/23, last TSH on  05/10/2020 within normal limits, additionally patient with history of sleep apnea although wears CPAP mask nightly and reports feeling refreshed after sleeping in the morning, denies daytime sleepiness; 2.  Blurry vision, bilateral-Advised patient to follow-up with PCP regarding fatigue and need for blood labs including TSH  free and total testosterone .  Encouraged patient to follow-up with ophthalmologist/optometrist regarding blurred vision.  Patient discharged home, hemodynamically  stable. Final Clinical Impressions(s) / UC Diagnoses   Final diagnoses:  Fatigue, unspecified type  Blurry vision, bilateral     Discharge Instructions      Advised patient to follow-up with PCP regarding fatigue and need for blood labs including TSH free and total testosterone .  Encouraged patient to follow-up with ophthalmologist/optometrist regarding blurred vision.     ED Prescriptions   None    PDMP not reviewed this encounter.   Teddy Sharper, FNP 08/31/23 1520

## 2023-08-31 NOTE — ED Triage Notes (Signed)
 Pt presents to uc with co fatigue for about one month with worsening over the last week unable to go to the gym this past week and new onset of blurred vision. Pt reports head feels foggy'

## 2023-09-03 DIAGNOSIS — G4733 Obstructive sleep apnea (adult) (pediatric): Secondary | ICD-10-CM | POA: Diagnosis not present

## 2023-09-04 ENCOUNTER — Telehealth: Payer: Self-pay

## 2023-09-04 DIAGNOSIS — E291 Testicular hypofunction: Secondary | ICD-10-CM

## 2023-09-04 DIAGNOSIS — I10 Essential (primary) hypertension: Secondary | ICD-10-CM

## 2023-09-04 DIAGNOSIS — E782 Mixed hyperlipidemia: Secondary | ICD-10-CM

## 2023-09-04 NOTE — Telephone Encounter (Signed)
 Copied from CRM [id number]. Topic: Clinical - Request for Lab/Test Order >> Sep 04, 2023 10:37 AM Alfonso ORN wrote: Reason for CRM: patient has an appointment with Donnice Bring on 09/07/23 at 10:10 am and patient is requesting to if can order blood work for  full blood panel with thyroid , patient would like to get his thyroid  check, recently went to urgent care and was recommended to  get thyroid  checked    Please call patient to let know when the order is placed would like to have done the labs before this Friday 09/07/23  patient  [phone]

## 2023-09-05 DIAGNOSIS — M9903 Segmental and somatic dysfunction of lumbar region: Secondary | ICD-10-CM | POA: Diagnosis not present

## 2023-09-05 DIAGNOSIS — M5136 Other intervertebral disc degeneration, lumbar region with discogenic back pain only: Secondary | ICD-10-CM | POA: Diagnosis not present

## 2023-09-05 DIAGNOSIS — M9901 Segmental and somatic dysfunction of cervical region: Secondary | ICD-10-CM | POA: Diagnosis not present

## 2023-09-05 DIAGNOSIS — M9906 Segmental and somatic dysfunction of lower extremity: Secondary | ICD-10-CM | POA: Diagnosis not present

## 2023-09-05 NOTE — Addendum Note (Signed)
 Addended by: Rorey Bisson E on: 09/05/2023 11:20 AM   Modules accepted: Orders

## 2023-09-05 NOTE — Telephone Encounter (Signed)
     09/05/23 11:31 AM Thank you sir,  I'll come by first thing tomorrow so i can fast.

## 2023-09-06 DIAGNOSIS — E291 Testicular hypofunction: Secondary | ICD-10-CM | POA: Diagnosis not present

## 2023-09-06 DIAGNOSIS — E782 Mixed hyperlipidemia: Secondary | ICD-10-CM | POA: Diagnosis not present

## 2023-09-06 DIAGNOSIS — I10 Essential (primary) hypertension: Secondary | ICD-10-CM | POA: Diagnosis not present

## 2023-09-07 ENCOUNTER — Ambulatory Visit: Admitting: Family Medicine

## 2023-09-07 ENCOUNTER — Encounter: Payer: Self-pay | Admitting: Family Medicine

## 2023-09-07 VITALS — BP 125/75 | HR 53 | Ht 76.0 in | Wt 289.0 lb

## 2023-09-07 DIAGNOSIS — R5383 Other fatigue: Secondary | ICD-10-CM | POA: Insufficient documentation

## 2023-09-07 DIAGNOSIS — E291 Testicular hypofunction: Secondary | ICD-10-CM

## 2023-09-07 LAB — PSA: Prostate Specific Ag, Serum: 0.8 ng/mL (ref 0.0–4.0)

## 2023-09-07 LAB — CMP14+EGFR
ALT: 40 IU/L (ref 0–44)
AST: 25 IU/L (ref 0–40)
Albumin: 4.8 g/dL (ref 4.1–5.1)
Alkaline Phosphatase: 80 IU/L (ref 44–121)
BUN/Creatinine Ratio: 19 (ref 9–20)
BUN: 19 mg/dL (ref 6–24)
Bilirubin Total: 0.4 mg/dL (ref 0.0–1.2)
CO2: 25 mmol/L (ref 20–29)
Calcium: 9.6 mg/dL (ref 8.7–10.2)
Chloride: 101 mmol/L (ref 96–106)
Creatinine, Ser: 0.99 mg/dL (ref 0.76–1.27)
Globulin, Total: 2.2 g/dL (ref 1.5–4.5)
Glucose: 93 mg/dL (ref 70–99)
Potassium: 4.9 mmol/L (ref 3.5–5.2)
Sodium: 138 mmol/L (ref 134–144)
Total Protein: 7 g/dL (ref 6.0–8.5)
eGFR: 93 mL/min/1.73 (ref >59)

## 2023-09-07 LAB — CBC WITH DIFFERENTIAL/PLATELET
Basophils Absolute: 0.1 x10E3/uL (ref 0.0–0.2)
Basos: 1 %
EOS (ABSOLUTE): 0.2 x10E3/uL (ref 0.0–0.4)
Eos: 2 %
Hematocrit: 47.6 % (ref 37.5–51.0)
Hemoglobin: 15.3 g/dL (ref 13.0–17.7)
Immature Grans (Abs): 0 x10E3/uL (ref 0.0–0.1)
Immature Granulocytes: 0 %
Lymphocytes Absolute: 2 x10E3/uL (ref 0.7–3.1)
Lymphs: 29 %
MCH: 29.9 pg (ref 26.6–33.0)
MCHC: 32.1 g/dL (ref 31.5–35.7)
MCV: 93 fL (ref 79–97)
Monocytes Absolute: 0.5 x10E3/uL (ref 0.1–0.9)
Monocytes: 7 %
Neutrophils Absolute: 4.3 x10E3/uL (ref 1.4–7.0)
Neutrophils: 61 %
Platelets: 206 x10E3/uL (ref 150–450)
RBC: 5.12 x10E6/uL (ref 4.14–5.80)
RDW: 13.2 % (ref 11.6–15.4)
WBC: 7.1 x10E3/uL (ref 3.4–10.8)

## 2023-09-07 LAB — LIPID PANEL WITH LDL/HDL RATIO
Cholesterol, Total: 170 mg/dL (ref 100–199)
HDL: 39 mg/dL — ABNORMAL LOW (ref >39)
LDL Chol Calc (NIH): 103 mg/dL — ABNORMAL HIGH (ref 0–99)
LDL/HDL Ratio: 2.6 ratio (ref 0.0–3.6)
Triglycerides: 158 mg/dL — ABNORMAL HIGH (ref 0–149)
VLDL Cholesterol Cal: 28 mg/dL (ref 5–40)

## 2023-09-07 LAB — TESTOSTERONE: Testosterone: 935 ng/dL — ABNORMAL HIGH (ref 264–916)

## 2023-09-07 LAB — TSH+FREE T4
Free T4: 0.98 ng/dL (ref 0.82–1.77)
TSH: 2.07 u[IU]/mL (ref 0.450–4.500)

## 2023-09-07 MED ORDER — TESTOSTERONE CYPIONATE 200 MG/ML IM SOLN: 100.0000 mg | INTRAMUSCULAR | 3 refills | Status: AC

## 2023-09-07 NOTE — Assessment & Plan Note (Signed)
 Notes increased fatigue.  Recent labs relatively unremarkable.  He will try testosterone  100mg  q7 days.  F/u in 1 month.

## 2023-09-07 NOTE — Progress Notes (Signed)
 Russell Ortega - 48 y.o. male MRN [id number]  Date of birth: [birth year]  Subjective Chief Complaint  Patient presents with   Fatigue   Blurred Vision    HPI Russell Ortega is a 48 y.o. male here today with complaint of fatigue.  He first noticed this about 1 month ago.    Seen at urgent care who recommend f/u with PCP for updated labs and further evaluation of fatigue.  Reports that he continues to work out but feels less motivated and sometimes just crashes when he gets home.  He is using CPAP regularly and was last seen by pulmonology in May.   He would like to try changing testosterone  to weekly dosing as some of his readings have been low at q10 day dosing, ever a few days after injection.  His most recently level is 935 about 1 week after injection.  He denies chest pain, shortness of breath, palpitations.   ROS:  A comprehensive ROS was completed and negative except as noted per HPI  Allergies  Allergen Reactions   Lidocaine Nausea And Vomiting    Can tolerate Tetracaine   Tramadol Rash    Past Medical History:  Diagnosis Date   Anxiety    Back pain    Hypogonadism in male 06/26/2014   Sleep apnea     Past Surgical History:  Procedure Laterality Date   MICRODISCECTOMY LUMBAR      Social History   Socioeconomic History   Marital status: Married    Spouse name: Not on file   Number of children: Not on file   Years of education: Not on file   Highest education level: Bachelor's degree (e.g., BA, AB, BS)  Occupational History   Not on file  Tobacco Use   Smoking status: Never   Smokeless tobacco: Never  Substance and Sexual Activity   Alcohol use: Yes    Alcohol/week: 0.0 standard drinks of alcohol   Drug use: No   Sexual activity: Yes    Partners: Female  Other Topics Concern   Not on file  Social History Narrative   Not on file   Social Drivers of Health   Financial Resource Strain: Low Risk  (09/03/2023)   Overall Financial Resource Strain (CARDIA)     Difficulty of Paying Living Expenses: Not hard at all  Food Insecurity: No Food Insecurity (09/03/2023)   Hunger Vital Sign    Worried About Running Out of Food in the Last Year: Never true    Ran Out of Food in the Last Year: Never true  Transportation Needs: No Transportation Needs (09/03/2023)   PRAPARE - Administrator, Civil Service (Medical): No    Lack of Transportation (Non-Medical): No  Physical Activity: Sufficiently Active (09/03/2023)   Exercise Vital Sign    Days of Exercise per Week: 4 days    Minutes of Exercise per Session: 90 min  Stress: Stress Concern Present (09/03/2023)   [employer] of Occupational Health - Occupational Stress Questionnaire    Feeling of Stress: Rather much  Social Connections: Moderately Integrated (09/03/2023)   Social Connection and Isolation Panel    Frequency of Communication with Friends and Family: Once a week    Frequency of Social Gatherings with Friends and Family: Never    Attends Religious Services: More than 4 times per year    Active Member of [employer] or Organizations: Yes    Attends Banker Meetings: 1 to 4 times per year  Marital Status: Married    Family History  Problem Relation Age of Onset   Breast cancer Mother    Hypertension Mother    Cancer Mother    Depression Mother    Anxiety disorder Mother    Alcoholism Mother    Obesity Mother    Hyperlipidemia Father    Hypertension Father    Diabetes Father    Obesity Father    Sleep apnea Father     Health Maintenance  Topic Date Due   Hepatitis C Screening  Never done   Hepatitis B Vaccines (1 of 3 - 19+ 3-dose series) Never done   COVID-19 Vaccine (3 - Moderna risk series) 11/30/2023 (Originally 10/27/2019)   INFLUENZA VACCINE  09/21/2023   DTaP/Tdap/Td (3 - Td or Tdap) 03/20/2024   Colonoscopy  11/09/2030   HIV Screening  Completed   HPV VACCINES  Aged Out   Meningococcal B Vaccine  Aged Out      ----------------------------------------------------------------------------------------------------------------------------------------------------------------------------------------------------------------- Physical Exam BP 125/75 (BP Location: Left Arm, Patient Position: Sitting, Cuff Size: Large)   Pulse (!) 53   Ht 6' 4 (1.93 m)   Wt 289 lb (131.1 kg)   SpO2 98%   BMI 35.18 kg/m   Physical Exam Constitutional:      Appearance: Normal appearance.  Cardiovascular:     Rate and Rhythm: Normal rate and regular rhythm.  Pulmonary:     Effort: Pulmonary effort is normal.     Breath sounds: Normal breath sounds.  Neurological:     General: No focal deficit present.     Mental Status: He is alert.  Psychiatric:        Mood and Affect: Mood normal.        Behavior: Behavior normal.     ------------------------------------------------------------------------------------------------------------------------------------------------------------------------------------------------------------------- Assessment and Plan  Hypogonadism in male Notes increased fatigue.  Recent labs relatively unremarkable.  He will try testosterone  100mg  q7 days.  F/u in 1 month.   Fatigue Making chane in testosterone .  May need to adjust effexor  if this is not helpful.  We also discussed checking ferritin and B12 levels if this persists.    Meds ordered this encounter  Medications   testosterone  cypionate (DEPOTESTOSTERONE CYPIONATE) 200 MG/ML injection    Sig: Inject 0.5 mLs (100 mg total) into the muscle once a week.    Dispense:  6 mL    Refill:  3    Return in about 6 weeks (around 10/19/2023) for Fatigue.

## 2023-09-07 NOTE — Assessment & Plan Note (Signed)
 Making chane in testosterone .  May need to adjust effexor  if this is not helpful.  We also discussed checking ferritin and B12 levels if this persists.

## 2023-09-17 ENCOUNTER — Ambulatory Visit: Admitting: Family Medicine

## 2023-09-26 DIAGNOSIS — M9906 Segmental and somatic dysfunction of lower extremity: Secondary | ICD-10-CM | POA: Diagnosis not present

## 2023-09-26 DIAGNOSIS — M5136 Other intervertebral disc degeneration, lumbar region with discogenic back pain only: Secondary | ICD-10-CM | POA: Diagnosis not present

## 2023-09-26 DIAGNOSIS — M9903 Segmental and somatic dysfunction of lumbar region: Secondary | ICD-10-CM | POA: Diagnosis not present

## 2023-09-26 DIAGNOSIS — M9901 Segmental and somatic dysfunction of cervical region: Secondary | ICD-10-CM | POA: Diagnosis not present

## 2023-10-17 DIAGNOSIS — M9901 Segmental and somatic dysfunction of cervical region: Secondary | ICD-10-CM | POA: Diagnosis not present

## 2023-10-17 DIAGNOSIS — M9906 Segmental and somatic dysfunction of lower extremity: Secondary | ICD-10-CM | POA: Diagnosis not present

## 2023-10-17 DIAGNOSIS — M5136 Other intervertebral disc degeneration, lumbar region with discogenic back pain only: Secondary | ICD-10-CM | POA: Diagnosis not present

## 2023-10-17 DIAGNOSIS — M9903 Segmental and somatic dysfunction of lumbar region: Secondary | ICD-10-CM | POA: Diagnosis not present

## 2023-10-19 ENCOUNTER — Encounter: Payer: Self-pay | Admitting: Family Medicine

## 2023-10-19 ENCOUNTER — Ambulatory Visit: Admitting: Family Medicine

## 2023-10-19 VITALS — BP 120/71 | HR 53 | Ht 76.0 in | Wt 293.0 lb

## 2023-10-19 DIAGNOSIS — E291 Testicular hypofunction: Secondary | ICD-10-CM | POA: Diagnosis not present

## 2023-10-19 MED ORDER — BD INTEGRA SYRINGE 22G X 1-1/2" 3 ML MISC: 0 refills | Status: AC

## 2023-10-19 MED ORDER — BD HYPODERMIC NEEDLE 18G X 1" MISC: 1 refills | Status: AC

## 2023-10-19 NOTE — Addendum Note (Signed)
 Addended by: ALVIA BRING E on: 10/19/2023 11:30 AM   Modules accepted: Orders

## 2023-10-19 NOTE — Assessment & Plan Note (Signed)
 Doing better with injecitng 100mg  weekly.  Checking estradiol levels as he feels that he is retaining a little fluid.

## 2023-10-19 NOTE — Progress Notes (Signed)
 Russell Ortega - 48 y.o. male MRN [id number]  Date of birth: 01-28-1976  Subjective Chief Complaint  Patient presents with   Fatigue    HPI Russell Ortega is a 48 y.o. male here today for follow up visit.   He reports that fatigue may be a little better.  He is now injecting 100mg  weekly.  This seems to be helpful. Other labs recently were unremarkable.  He does feel that he may be retaining some water weight.  He has not changed his workout habits or eating habits.    ROS:  A comprehensive ROS was completed and negative except as noted per HPI  Allergies  Allergen Reactions   Lidocaine Nausea And Vomiting    Can tolerate Tetracaine   Tramadol Rash    Past Medical History:  Diagnosis Date   Anxiety    Back pain    Hypogonadism in male 06/26/2014   Sleep apnea     Past Surgical History:  Procedure Laterality Date   MICRODISCECTOMY LUMBAR      Social History   Socioeconomic History   Marital status: Married    Spouse name: Not on file   Number of children: Not on file   Years of education: Not on file   Highest education level: Bachelor's degree (e.g., BA, AB, BS)  Occupational History   Not on file  Tobacco Use   Smoking status: Never   Smokeless tobacco: Never  Substance and Sexual Activity   Alcohol use: Yes    Alcohol/week: 0.0 standard drinks of alcohol   Drug use: No   Sexual activity: Yes    Partners: Female  Other Topics Concern   Not on file  Social History Narrative   Not on file   Social Drivers of Health   Financial Resource Strain: Low Risk  (09/03/2023)   Overall Financial Resource Strain (CARDIA)    Difficulty of Paying Living Expenses: Not hard at all  Food Insecurity: No Food Insecurity (09/03/2023)   Hunger Vital Sign    Worried About Running Out of Food in the Last Year: Never true    Ran Out of Food in the Last Year: Never true  Transportation Needs: No Transportation Needs (09/03/2023)   PRAPARE - Scientist, research (physical sciences) (Medical): No    Lack of Transportation (Non-Medical): No  Physical Activity: Sufficiently Active (09/03/2023)   Exercise Vital Sign    Days of Exercise per Week: 4 days    Minutes of Exercise per Session: 90 min  Stress: Stress Concern Present (09/03/2023)   [employer] of Occupational Health - Occupational Stress Questionnaire    Feeling of Stress: Rather much  Social Connections: Moderately Integrated (09/03/2023)   Social Connection and Isolation Panel    Frequency of Communication with Friends and Family: Once a week    Frequency of Social Gatherings with Friends and Family: Never    Attends Religious Services: More than 4 times per year    Active Member of [employer] or Organizations: Yes    Attends Banker Meetings: 1 to 4 times per year    Marital Status: Married    Family History  Problem Relation Age of Onset   Breast cancer Mother    Hypertension Mother    Cancer Mother    Depression Mother    Anxiety disorder Mother    Alcoholism Mother    Obesity Mother    Hyperlipidemia Father    Hypertension Father    Diabetes  Father    Obesity Father    Sleep apnea Father     Health Maintenance  Topic Date Due   Hepatitis C Screening  Never done   Hepatitis B Vaccines 19-59 Average Risk (1 of 3 - 19+ 3-dose series) Never done   COVID-19 Vaccine (3 - Moderna risk series) 11/30/2023 (Originally 10/27/2019)   INFLUENZA VACCINE  05/20/2024 (Originally 09/21/2023)   DTaP/Tdap/Td (3 - Td or Tdap) 03/20/2024   Colonoscopy  11/09/2030   HIV Screening  Completed   Pneumococcal Vaccine  Aged Out   HPV VACCINES  Aged Out   Meningococcal B Vaccine  Aged Out     ----------------------------------------------------------------------------------------------------------------------------------------------------------------------------------------------------------------- Physical Exam BP 120/71 (BP Location: Left Arm, Patient Position: Sitting, Cuff Size:  Large)   Pulse (!) 53   Ht 6' 4 (1.93 m)   Wt 293 lb (132.9 kg)   SpO2 97%   BMI 35.67 kg/m   Physical Exam Constitutional:      Appearance: Normal appearance.  Cardiovascular:     Rate and Rhythm: Normal rate and regular rhythm.  Pulmonary:     Effort: Pulmonary effort is normal.     Breath sounds: Normal breath sounds.  Neurological:     General: No focal deficit present.     Mental Status: He is alert.     ------------------------------------------------------------------------------------------------------------------------------------------------------------------------------------------------------------------- Assessment and Plan  Hypogonadism in male Doing better with injecitng 100mg  weekly.  Checking estradiol levels as he feels that he is retaining a little fluid.     No orders of the defined types were placed in this encounter.   No follow-ups on file.

## 2023-10-20 LAB — ESTRADIOL: Estradiol: 54.1 pg/mL — ABNORMAL HIGH (ref 7.6–42.6)

## 2023-10-23 ENCOUNTER — Encounter: Payer: Self-pay | Admitting: Family Medicine

## 2023-10-23 MED ORDER — ANASTROZOLE 1 MG PO TABS: ORAL_TABLET | ORAL | 0 refills | Status: AC

## 2023-11-14 DIAGNOSIS — M9906 Segmental and somatic dysfunction of lower extremity: Secondary | ICD-10-CM | POA: Diagnosis not present

## 2023-11-14 DIAGNOSIS — M9903 Segmental and somatic dysfunction of lumbar region: Secondary | ICD-10-CM | POA: Diagnosis not present

## 2023-11-14 DIAGNOSIS — M5136 Other intervertebral disc degeneration, lumbar region with discogenic back pain only: Secondary | ICD-10-CM | POA: Diagnosis not present

## 2023-11-14 DIAGNOSIS — M9901 Segmental and somatic dysfunction of cervical region: Secondary | ICD-10-CM | POA: Diagnosis not present

## 2023-11-29 ENCOUNTER — Ambulatory Visit: Admitting: Family Medicine

## 2023-12-27 ENCOUNTER — Other Ambulatory Visit: Payer: Self-pay

## 2023-12-27 MED ORDER — VENLAFAXINE HCL ER 75 MG PO CP24: ORAL_CAPSULE | ORAL | 1 refills | Status: AC

## 2024-01-04 DIAGNOSIS — Z713 Dietary counseling and surveillance: Secondary | ICD-10-CM | POA: Diagnosis not present

## 2024-01-09 DIAGNOSIS — M9901 Segmental and somatic dysfunction of cervical region: Secondary | ICD-10-CM | POA: Diagnosis not present

## 2024-01-09 DIAGNOSIS — M9903 Segmental and somatic dysfunction of lumbar region: Secondary | ICD-10-CM | POA: Diagnosis not present

## 2024-01-09 DIAGNOSIS — M9906 Segmental and somatic dysfunction of lower extremity: Secondary | ICD-10-CM | POA: Diagnosis not present

## 2024-01-09 DIAGNOSIS — M5136 Other intervertebral disc degeneration, lumbar region with discogenic back pain only: Secondary | ICD-10-CM | POA: Diagnosis not present

## 2024-01-24 DIAGNOSIS — G4733 Obstructive sleep apnea (adult) (pediatric): Secondary | ICD-10-CM | POA: Diagnosis not present

## 2024-02-06 DIAGNOSIS — M9903 Segmental and somatic dysfunction of lumbar region: Secondary | ICD-10-CM | POA: Diagnosis not present

## 2024-02-06 DIAGNOSIS — M9901 Segmental and somatic dysfunction of cervical region: Secondary | ICD-10-CM | POA: Diagnosis not present

## 2024-02-06 DIAGNOSIS — M9906 Segmental and somatic dysfunction of lower extremity: Secondary | ICD-10-CM | POA: Diagnosis not present

## 2024-02-06 DIAGNOSIS — M5136 Other intervertebral disc degeneration, lumbar region with discogenic back pain only: Secondary | ICD-10-CM | POA: Diagnosis not present

## 2024-04-18 ENCOUNTER — Ambulatory Visit: Admitting: Family Medicine
